# Patient Record
Sex: Male | Born: 1965 | Race: Black or African American | Hispanic: No | Marital: Married | Smoking: Never smoker
Health system: Southern US, Community
[De-identification: ages and names within clinical notes are randomized; demographics above are authoritative.]

## PROBLEM LIST (undated history)

## (undated) DIAGNOSIS — G40909 Epilepsy, unspecified, not intractable, without status epilepticus: Secondary | ICD-10-CM

## (undated) DIAGNOSIS — E785 Hyperlipidemia, unspecified: Secondary | ICD-10-CM

## (undated) DIAGNOSIS — E669 Obesity, unspecified: Secondary | ICD-10-CM

## (undated) DIAGNOSIS — N451 Epididymitis: Secondary | ICD-10-CM

## (undated) DIAGNOSIS — I10 Essential (primary) hypertension: Secondary | ICD-10-CM

## (undated) HISTORY — DX: Hyperlipidemia, unspecified: E78.5

## (undated) HISTORY — DX: Obesity, unspecified: E66.9

## (undated) HISTORY — DX: Epilepsy, unspecified, not intractable, without status epilepticus: G40.909

## (undated) HISTORY — DX: Essential (primary) hypertension: I10

## (undated) HISTORY — DX: Epididymitis: N45.1

---

## 2004-03-22 ENCOUNTER — Ambulatory Visit: Payer: Self-pay | Admitting: Family Medicine

## 2004-04-02 ENCOUNTER — Ambulatory Visit: Payer: Self-pay | Admitting: Family Medicine

## 2004-04-12 ENCOUNTER — Ambulatory Visit: Payer: Self-pay | Admitting: Family Medicine

## 2004-07-22 ENCOUNTER — Ambulatory Visit: Payer: Self-pay | Admitting: Family Medicine

## 2004-11-27 ENCOUNTER — Ambulatory Visit: Payer: Self-pay | Admitting: Family Medicine

## 2005-03-27 ENCOUNTER — Ambulatory Visit: Payer: Self-pay | Admitting: Family Medicine

## 2005-07-25 ENCOUNTER — Ambulatory Visit: Payer: Self-pay | Admitting: Family Medicine

## 2005-12-08 ENCOUNTER — Ambulatory Visit: Payer: Self-pay | Admitting: Family Medicine

## 2005-12-09 ENCOUNTER — Ambulatory Visit: Payer: Self-pay | Admitting: Family Medicine

## 2006-04-13 ENCOUNTER — Ambulatory Visit: Payer: Self-pay | Admitting: Family Medicine

## 2006-04-14 ENCOUNTER — Encounter (INDEPENDENT_AMBULATORY_CARE_PROVIDER_SITE_OTHER): Payer: Self-pay | Admitting: *Deleted

## 2006-04-14 ENCOUNTER — Encounter: Payer: Self-pay | Admitting: Family Medicine

## 2006-04-14 LAB — CONVERTED CEMR LAB
ALT: 33 units/L (ref 0–53)
AST: 20 units/L (ref 0–37)
Albumin: 4.5 g/dL (ref 3.5–5.2)
Alkaline Phosphatase: 73 units/L (ref 39–117)
BUN: 10 mg/dL (ref 6–23)
Bilirubin, Direct: 0.1 mg/dL (ref 0.0–0.3)
CO2: 24 meq/L (ref 19–32)
Calcium: 10.1 mg/dL (ref 8.4–10.5)
Chloride: 102 meq/L (ref 96–112)
Cholesterol: 187 mg/dL (ref 0–200)
Creatinine, Ser: 0.87 mg/dL (ref 0.40–1.50)
Glucose, Bld: 91 mg/dL (ref 70–99)
HDL: 55 mg/dL (ref >39)
Indirect Bilirubin: 0.4 mg/dL (ref 0.0–0.9)
LDL Cholesterol: 113 mg/dL — ABNORMAL HIGH (ref 0–99)
PSA: 0.63 ng/mL (ref 0.10–4.00)
Phenytoin Lvl: 20.2 ug/mL — ABNORMAL HIGH (ref 10.0–20.0)
Potassium: 4.3 meq/L (ref 3.5–5.3)
Sodium: 142 meq/L (ref 135–145)
Total Bilirubin: 0.5 mg/dL (ref 0.3–1.2)
Total CHOL/HDL Ratio: 3.4
Total Protein: 8 g/dL (ref 6.0–8.3)
Triglycerides: 96 mg/dL (ref <150)
VLDL: 19 mg/dL (ref 0–40)

## 2006-07-05 ENCOUNTER — Emergency Department (HOSPITAL_COMMUNITY): Admission: EM | Admit: 2006-07-05 | Discharge: 2006-07-05 | Payer: Self-pay | Admitting: Emergency Medicine

## 2006-07-06 ENCOUNTER — Ambulatory Visit: Payer: Self-pay | Admitting: Family Medicine

## 2006-07-06 LAB — CONVERTED CEMR LAB: Phenytoin Lvl: 15.9 ug/mL (ref 10.0–20.0)

## 2006-07-07 ENCOUNTER — Ambulatory Visit (HOSPITAL_COMMUNITY): Admission: RE | Admit: 2006-07-07 | Discharge: 2006-07-07 | Payer: Self-pay | Admitting: Family Medicine

## 2006-07-13 ENCOUNTER — Encounter: Payer: Self-pay | Admitting: Family Medicine

## 2006-07-20 ENCOUNTER — Encounter: Payer: Self-pay | Admitting: Family Medicine

## 2006-07-27 ENCOUNTER — Encounter: Payer: Self-pay | Admitting: Family Medicine

## 2006-07-27 LAB — CONVERTED CEMR LAB: Phenytoin Lvl: 22 ug/mL — ABNORMAL HIGH (ref 10.0–20.0)

## 2006-08-10 ENCOUNTER — Encounter: Payer: Self-pay | Admitting: Family Medicine

## 2006-08-10 LAB — CONVERTED CEMR LAB: Phenytoin Lvl: 21.1 ug/mL — ABNORMAL HIGH (ref 10.0–20.0)

## 2006-09-08 ENCOUNTER — Encounter: Payer: Self-pay | Admitting: Family Medicine

## 2006-09-09 ENCOUNTER — Ambulatory Visit: Payer: Self-pay | Admitting: Family Medicine

## 2006-10-28 ENCOUNTER — Ambulatory Visit: Payer: Self-pay | Admitting: Family Medicine

## 2006-12-07 ENCOUNTER — Encounter (INDEPENDENT_AMBULATORY_CARE_PROVIDER_SITE_OTHER): Payer: Self-pay | Admitting: *Deleted

## 2006-12-07 ENCOUNTER — Encounter: Payer: Self-pay | Admitting: Family Medicine

## 2006-12-07 LAB — CONVERTED CEMR LAB
ALT: 39 units/L (ref 0–53)
AST: 26 units/L (ref 0–37)
Albumin: 4.3 g/dL (ref 3.5–5.2)
Alkaline Phosphatase: 71 units/L (ref 39–117)
Basophils Absolute: 0 10*3/uL (ref 0.0–0.1)
Basophils Relative: 1 % (ref 0–1)
Blood Glucose, Fasting: 89 mg/dL
CO2: 28 meq/L (ref 19–32)
Calcium: 9.6 mg/dL (ref 8.4–10.5)
Cholesterol: 181 mg/dL (ref 0–200)
Creatinine, Ser: 0.87 mg/dL (ref 0.40–1.50)
HDL: 51 mg/dL (ref >39)
Lymphs Abs: 1.8 10*3/uL (ref 0.7–3.3)
Monocytes Relative: 10 % (ref 3–11)
Neutro Abs: 1.6 10*3/uL — ABNORMAL LOW (ref 1.7–7.7)
Neutrophils Relative %: 40 % — ABNORMAL LOW (ref 43–77)
Phenytoin Lvl: 15.2 ug/mL (ref 10.0–20.0)
RBC count: 5.22 10*6/uL
RBC: 5.22 M/uL (ref 4.22–5.81)
Sodium: 140 meq/L (ref 135–145)
Total Bilirubin: 0.3 mg/dL (ref 0.3–1.2)
Total CHOL/HDL Ratio: 3.5
Triglycerides: 219 mg/dL — ABNORMAL HIGH (ref <150)
WBC: 3.9 10*3/uL — ABNORMAL LOW (ref 4.0–10.5)

## 2007-02-23 ENCOUNTER — Ambulatory Visit: Payer: Self-pay | Admitting: Family Medicine

## 2007-03-30 ENCOUNTER — Encounter: Payer: Self-pay | Admitting: *Deleted

## 2007-03-30 DIAGNOSIS — R569 Unspecified convulsions: Secondary | ICD-10-CM

## 2007-03-30 DIAGNOSIS — E785 Hyperlipidemia, unspecified: Secondary | ICD-10-CM

## 2007-03-30 DIAGNOSIS — I10 Essential (primary) hypertension: Secondary | ICD-10-CM | POA: Insufficient documentation

## 2007-04-26 ENCOUNTER — Ambulatory Visit: Payer: Self-pay | Admitting: Family Medicine

## 2007-06-01 ENCOUNTER — Encounter: Payer: Self-pay | Admitting: Family Medicine

## 2007-06-01 LAB — CONVERTED CEMR LAB
ALT: 28 units/L (ref 0–53)
Chloride: 106 meq/L (ref 96–112)
Cholesterol: 172 mg/dL (ref 0–200)
Glucose, Bld: 90 mg/dL (ref 70–99)
LDL Cholesterol: 99 mg/dL (ref 0–99)
PSA: 0.72 ng/mL (ref 0.10–4.00)
Phenytoin Lvl: 7.4 ug/mL — ABNORMAL LOW (ref 10.0–20.0)
Potassium: 3.5 meq/L (ref 3.5–5.3)
Sodium: 142 meq/L (ref 135–145)
Total CHOL/HDL Ratio: 3.5
Total Protein: 7.1 g/dL (ref 6.0–8.3)
Triglycerides: 119 mg/dL (ref <150)
VLDL: 24 mg/dL (ref 0–40)

## 2007-06-03 ENCOUNTER — Ambulatory Visit: Payer: Self-pay | Admitting: Family Medicine

## 2007-06-03 ENCOUNTER — Encounter: Payer: Self-pay | Admitting: Family Medicine

## 2007-06-09 ENCOUNTER — Encounter: Payer: Self-pay | Admitting: Family Medicine

## 2007-06-09 LAB — CONVERTED CEMR LAB: Phenytoin Lvl: 16 ug/mL (ref 10.0–20.0)

## 2007-09-20 ENCOUNTER — Encounter: Payer: Self-pay | Admitting: Family Medicine

## 2007-09-20 LAB — CONVERTED CEMR LAB
ALT: 29 units/L (ref 0–53)
AST: 16 units/L (ref 0–37)
Alkaline Phosphatase: 71 units/L (ref 39–117)
BUN: 12 mg/dL (ref 6–23)
Calcium: 8.6 mg/dL (ref 8.4–10.5)
Cholesterol: 220 mg/dL — ABNORMAL HIGH (ref 0–200)
Creatinine, Ser: 0.77 mg/dL (ref 0.40–1.50)
Total Protein: 7.1 g/dL (ref 6.0–8.3)
Triglycerides: 163 mg/dL — ABNORMAL HIGH (ref <150)

## 2007-11-02 IMAGING — CT CT HEAD W/O CM
1 series · 16 of 30 positions shown, 20 images · IV contrast (agent unspecified)
Comparison: None.

CLINICAL DATA: Seizure. 
 HEAD CT WITHOUT CONTRAST:
TECHNIQUE: Contiguous axial images were obtained from the base of the skull through the vertex according to standard protocol without contrast.

[Series 2: headseq 4.8 h37s · axial · 0.43mm/px · z∈[+66,+221]mm · 16 of 36 slices shown, 20 images]
[im 2/36  brain]
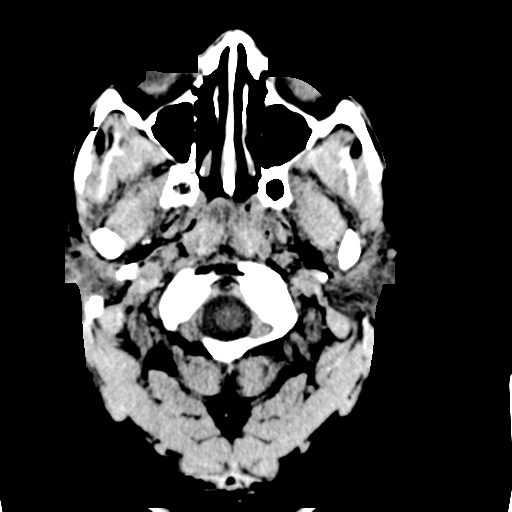
[im 2/36  bone]
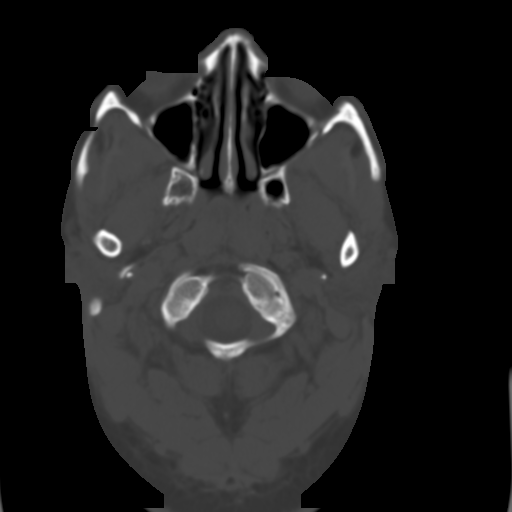
[im 4/36  brain]
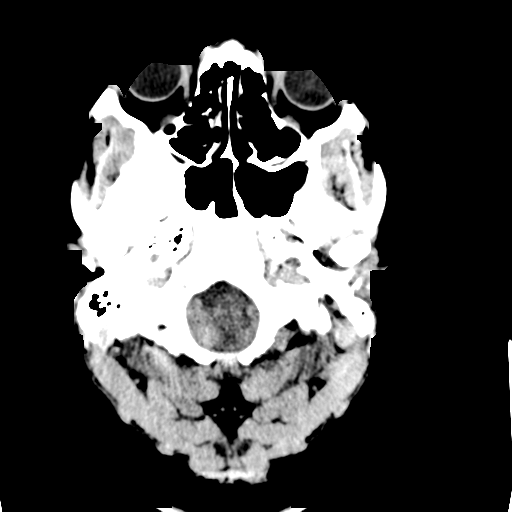
[im 7/36  brain]
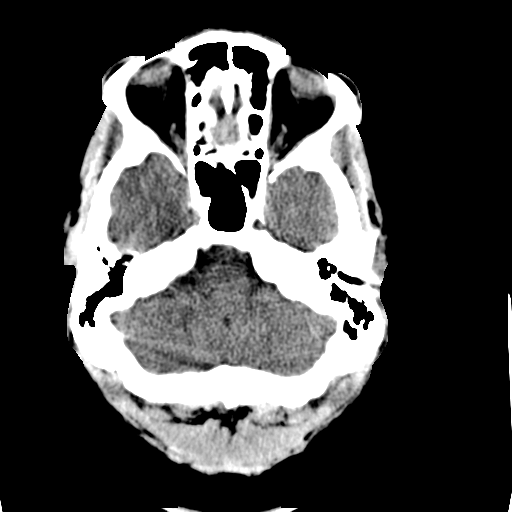
[im 9/36  brain]
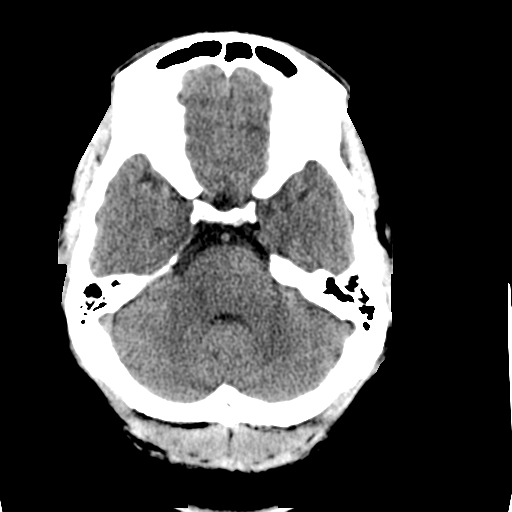
[im 10/36  brain]
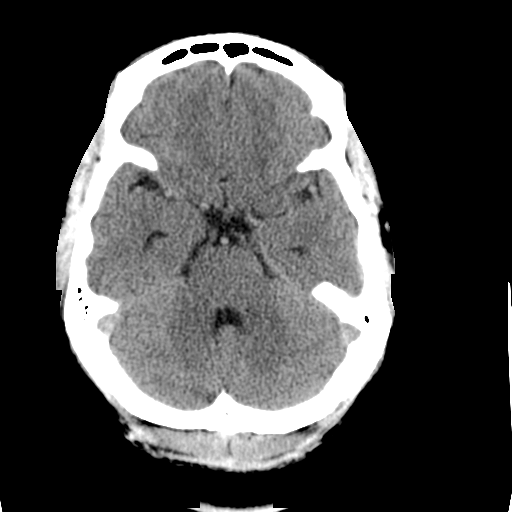
[im 10/36  bone]
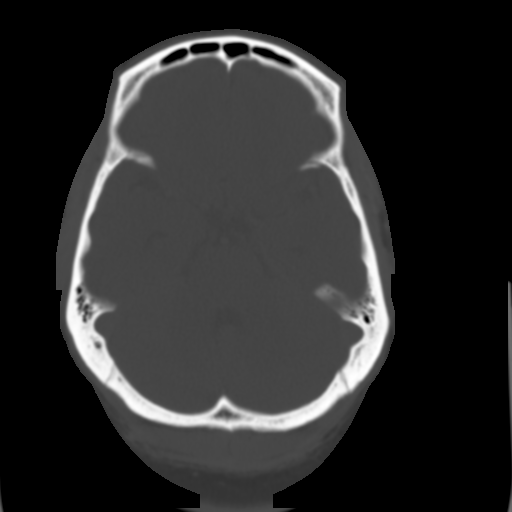
[im 13/36  brain]
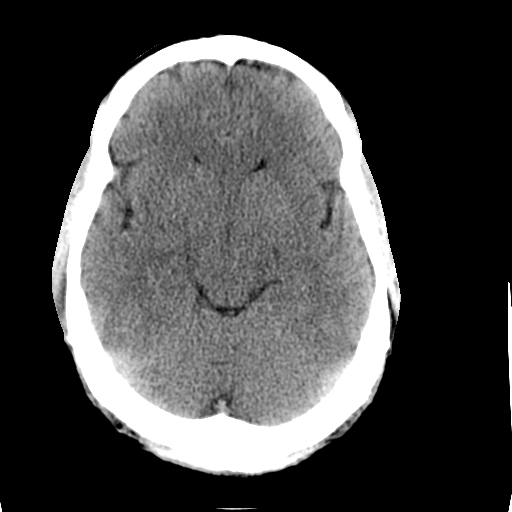
[im 15/36  brain]
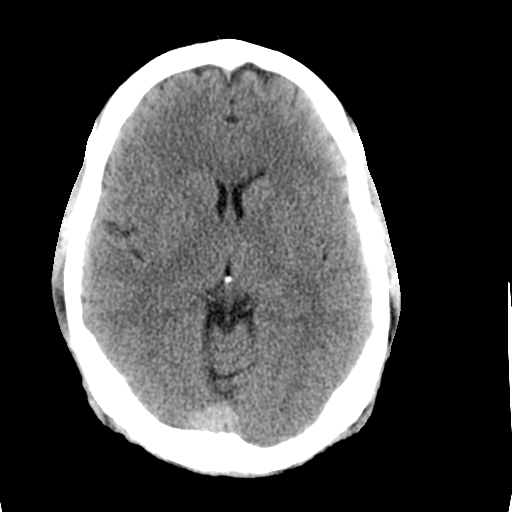
[im 17/36  brain]
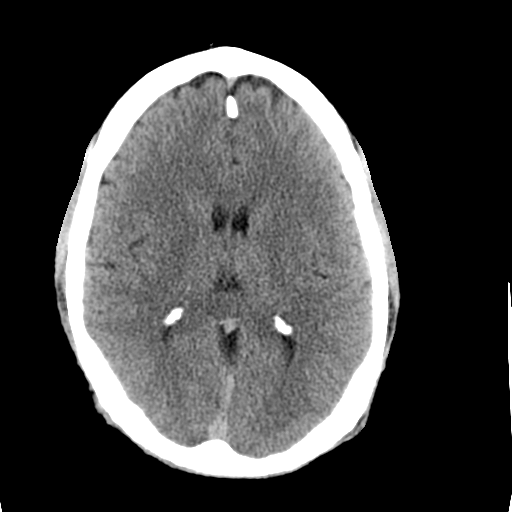
[im 19/36  brain]
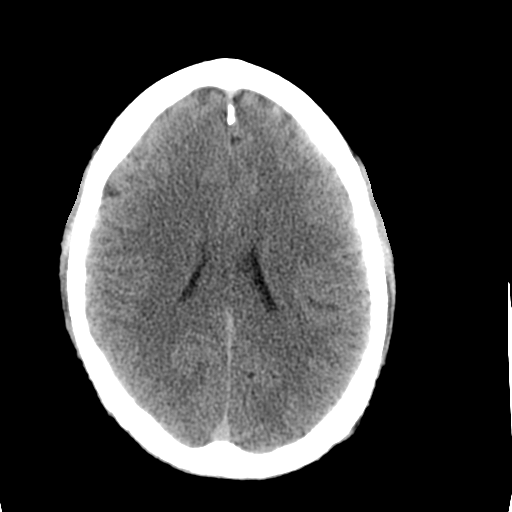
[im 19/36  bone]
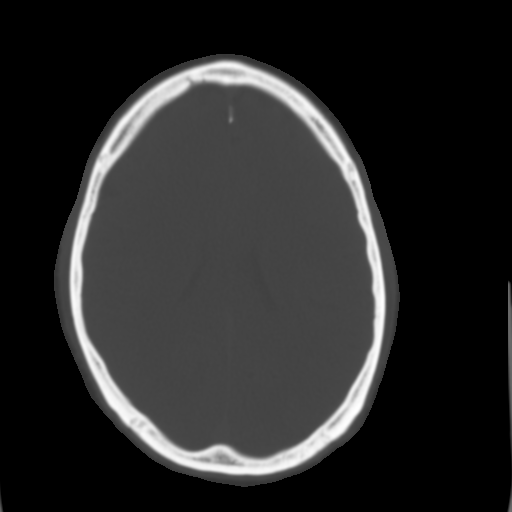
[im 21/36  brain]
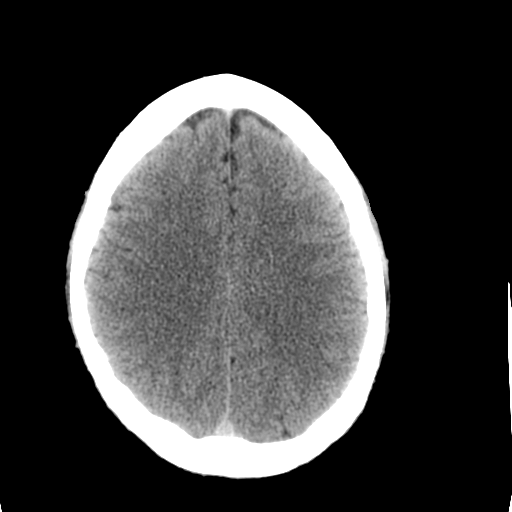
[im 23/36  brain]
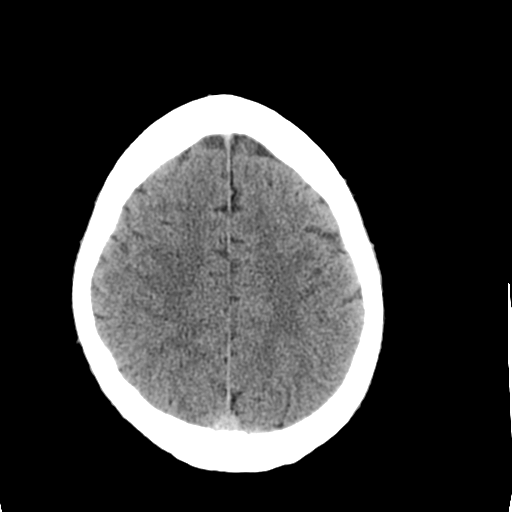
[im 26/36  brain]
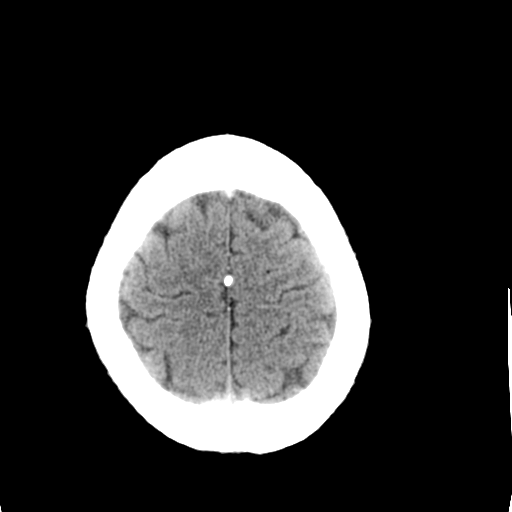
[im 27/36  brain]
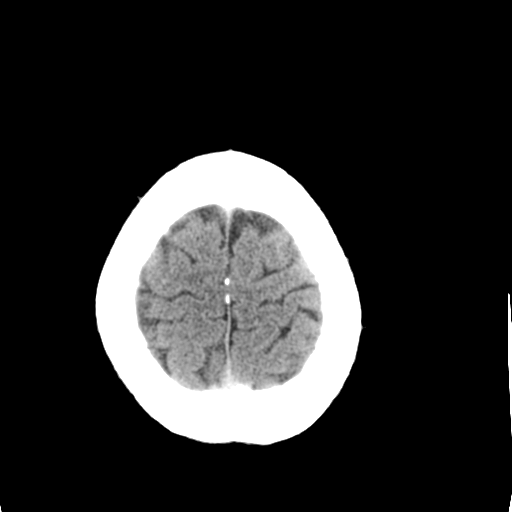
[im 27/36  bone]
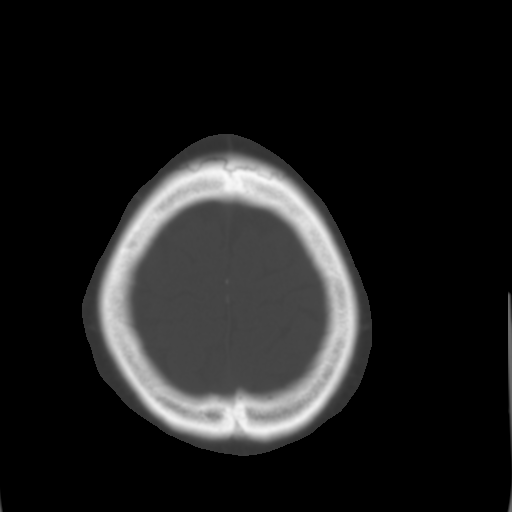
[im 29/36  brain]
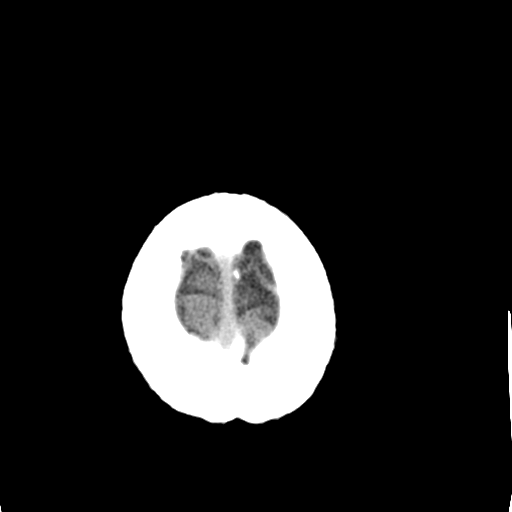
[im 32/36  brain]
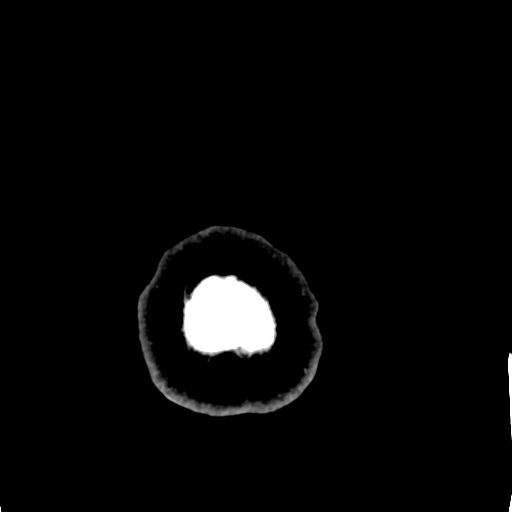
[im 34/36  brain]
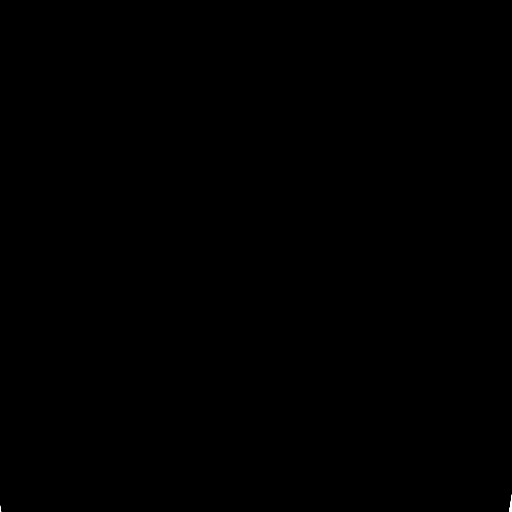

[16 of 30 positions shown; findings below may reference images not displayed]

FINDINGS: There is no evidence of intracranial hemorrhage, brain edema, acute infarct, mass lesion, or mass effect.  No other intra-axial abnormalities are seen, and the ventricles are within normal limits.  No abnormal extra-axial fluid collections or masses are identified.  No skull abnormalities are noted.
IMPRESSION: Negative non-contrast head CT.

## 2007-11-09 ENCOUNTER — Ambulatory Visit: Payer: Self-pay | Admitting: Family Medicine

## 2007-11-12 ENCOUNTER — Encounter: Payer: Self-pay | Admitting: Family Medicine

## 2008-02-08 ENCOUNTER — Ambulatory Visit: Payer: Self-pay | Admitting: Family Medicine

## 2008-02-14 LAB — CONVERTED CEMR LAB
ALT: 35 units/L (ref 0–53)
Bilirubin, Direct: 0.1 mg/dL (ref 0.0–0.3)
Cholesterol: 232 mg/dL — ABNORMAL HIGH (ref 0–200)
LDL Cholesterol: 141 mg/dL — ABNORMAL HIGH (ref 0–99)
Total Bilirubin: 0.5 mg/dL (ref 0.3–1.2)
Total CHOL/HDL Ratio: 4.1
VLDL: 35 mg/dL (ref 0–40)

## 2008-06-14 ENCOUNTER — Telehealth: Payer: Self-pay | Admitting: Family Medicine

## 2008-07-11 ENCOUNTER — Ambulatory Visit: Payer: Self-pay | Admitting: Family Medicine

## 2008-07-11 DIAGNOSIS — R5383 Other fatigue: Secondary | ICD-10-CM

## 2008-07-11 DIAGNOSIS — R5381 Other malaise: Secondary | ICD-10-CM | POA: Insufficient documentation

## 2008-07-11 DIAGNOSIS — J309 Allergic rhinitis, unspecified: Secondary | ICD-10-CM | POA: Insufficient documentation

## 2008-07-12 LAB — CONVERTED CEMR LAB
Bilirubin, Direct: 0.1 mg/dL (ref 0.0–0.3)
CO2: 27 meq/L (ref 19–32)
Calcium: 9.7 mg/dL (ref 8.4–10.5)
Glucose, Bld: 93 mg/dL (ref 70–99)
MCV: 86.3 fL (ref 78.0–100.0)
PSA: 0.73 ng/mL (ref 0.10–4.00)
Phenytoin Lvl: 6.9 ug/mL — ABNORMAL LOW (ref 10.0–20.0)
Potassium: 3.8 meq/L (ref 3.5–5.3)
RBC: 5.17 M/uL (ref 4.22–5.81)
Sodium: 140 meq/L (ref 135–145)
TSH: 1.234 microintl units/mL (ref 0.350–4.500)
Total Bilirubin: 0.4 mg/dL (ref 0.3–1.2)
Total CHOL/HDL Ratio: 3.8
VLDL: 22 mg/dL (ref 0–40)
WBC: 4.2 10*3/uL (ref 4.0–10.5)

## 2008-09-12 ENCOUNTER — Telehealth: Payer: Self-pay | Admitting: Family Medicine

## 2008-09-15 ENCOUNTER — Telehealth: Payer: Self-pay | Admitting: Family Medicine

## 2008-11-14 ENCOUNTER — Ambulatory Visit: Payer: Self-pay | Admitting: Family Medicine

## 2008-11-14 LAB — CONVERTED CEMR LAB: OCCULT 1: NEGATIVE

## 2008-11-15 LAB — CONVERTED CEMR LAB
ALT: 33 units/L (ref 0–53)
AST: 24 units/L (ref 0–37)
Albumin: 4.5 g/dL (ref 3.5–5.2)
Calcium: 9.5 mg/dL (ref 8.4–10.5)
Cholesterol: 187 mg/dL (ref 0–200)
HDL: 59 mg/dL (ref >39)
Phenytoin Lvl: 16.8 ug/mL (ref 10.0–20.0)
Sodium: 139 meq/L (ref 135–145)
Total CHOL/HDL Ratio: 3.2

## 2008-12-22 ENCOUNTER — Encounter: Payer: Self-pay | Admitting: Family Medicine

## 2009-02-15 ENCOUNTER — Encounter: Payer: Self-pay | Admitting: Family Medicine

## 2009-03-19 ENCOUNTER — Ambulatory Visit: Payer: Self-pay | Admitting: Family Medicine

## 2009-03-26 LAB — CONVERTED CEMR LAB
AST: 20 units/L (ref 0–37)
Albumin: 4.5 g/dL (ref 3.5–5.2)
Alkaline Phosphatase: 64 units/L (ref 39–117)
CO2: 27 meq/L (ref 19–32)
Calcium: 9.3 mg/dL (ref 8.4–10.5)
Creatinine, Ser: 0.87 mg/dL (ref 0.40–1.50)
HDL: 55 mg/dL (ref >39)
Indirect Bilirubin: 0.3 mg/dL (ref 0.0–0.9)
Total Bilirubin: 0.4 mg/dL (ref 0.3–1.2)
Triglycerides: 122 mg/dL (ref <150)

## 2009-07-10 ENCOUNTER — Telehealth: Payer: Self-pay | Admitting: Family Medicine

## 2009-07-26 ENCOUNTER — Telehealth: Payer: Self-pay | Admitting: Family Medicine

## 2009-08-24 ENCOUNTER — Ambulatory Visit: Payer: Self-pay | Admitting: Family Medicine

## 2009-08-25 ENCOUNTER — Encounter: Payer: Self-pay | Admitting: Family Medicine

## 2009-08-28 LAB — CONVERTED CEMR LAB
ALT: 41 units/L (ref 0–53)
AST: 27 units/L (ref 0–37)
BUN: 11 mg/dL (ref 6–23)
Basophils Absolute: 0 10*3/uL (ref 0.0–0.1)
Basophils Relative: 1 % (ref 0–1)
Bilirubin, Direct: 0.1 mg/dL (ref 0.0–0.3)
CO2: 29 meq/L (ref 19–32)
Calcium: 9.9 mg/dL (ref 8.4–10.5)
Cholesterol: 145 mg/dL (ref 0–200)
Creatinine, Ser: 0.9 mg/dL (ref 0.40–1.50)
Eosinophils Absolute: 0.2 10*3/uL (ref 0.0–0.7)
Eosinophils Relative: 5 % (ref 0–5)
Glucose, Bld: 91 mg/dL (ref 70–99)
Hemoglobin: 16.1 g/dL (ref 13.0–17.0)
Indirect Bilirubin: 0.3 mg/dL (ref 0.0–0.9)
MCHC: 33.8 g/dL (ref 30.0–36.0)
MCV: 88.2 fL (ref 78.0–100.0)
Monocytes Absolute: 0.4 10*3/uL (ref 0.1–1.0)
Neutro Abs: 1.9 10*3/uL (ref 1.7–7.7)
RDW: 13.2 % (ref 11.5–15.5)
Total Bilirubin: 0.4 mg/dL (ref 0.3–1.2)
Vit D, 25-Hydroxy: 21 ng/mL — ABNORMAL LOW (ref 30–89)

## 2009-09-03 ENCOUNTER — Telehealth: Payer: Self-pay | Admitting: Family Medicine

## 2009-10-24 ENCOUNTER — Ambulatory Visit: Payer: Self-pay | Admitting: Family Medicine

## 2009-10-26 ENCOUNTER — Encounter: Payer: Self-pay | Admitting: Family Medicine

## 2010-01-14 ENCOUNTER — Telehealth: Payer: Self-pay | Admitting: Family Medicine

## 2010-01-17 ENCOUNTER — Encounter: Payer: Self-pay | Admitting: Family Medicine

## 2010-03-05 ENCOUNTER — Ambulatory Visit: Payer: Self-pay | Admitting: Family Medicine

## 2010-03-05 LAB — CONVERTED CEMR LAB: OCCULT 1: NEGATIVE

## 2010-03-07 LAB — CONVERTED CEMR LAB
AST: 24 units/L (ref 0–37)
Albumin: 4.7 g/dL (ref 3.5–5.2)
Alkaline Phosphatase: 79 units/L (ref 39–117)
CO2: 28 meq/L (ref 19–32)
Calcium: 9.9 mg/dL (ref 8.4–10.5)
Chloride: 98 meq/L (ref 96–112)
Creatinine, Ser: 0.86 mg/dL (ref 0.40–1.50)
HDL: 61 mg/dL (ref >39)
LDL Cholesterol: 144 mg/dL — ABNORMAL HIGH (ref 0–99)
Phenytoin Lvl: 15 ug/mL (ref 10.0–20.0)
Sodium: 140 meq/L (ref 135–145)
Total Bilirubin: 0.4 mg/dL (ref 0.3–1.2)
Total CHOL/HDL Ratio: 3.8
Total Protein: 8 g/dL (ref 6.0–8.3)
Triglycerides: 118 mg/dL (ref <150)

## 2010-04-09 NOTE — Progress Notes (Signed)
Summary: PAPER  Phone Note Call from Patient   Summary of Call: FAXED OVER A ORDER FOR SOMETHING  CALL BACK AT [phone] REF # [id number] NO LATER THAN WEDNESDAY AT 2 TO MAKE SURE PATIENT GETS WHAT HE WANTS Initial call taken by: Lind Guest,  September 03, 2009 2:11 PM  Follow-up for Phone Call        called patient, left message  no action will be taken unless patient calls back and requests these services, i have made an attempt to contact patient and left a message for him to return call Follow-up by: Adella Hare LPN,  September 03, 2009 2:15 PM

## 2010-04-09 NOTE — Progress Notes (Signed)
  Phone Note Call from Patient   Caller: Spouse Summary of Call: pt's 3 month of dilantin not delivered despite calling 2 weeks ago, requested a 1 week supply, this was called in to a local [facility], dilantin 100mg  3 in am and 4 in pm , #49 per spouse's directions.Call made on 01/12/2010 Initial call taken by: Syliva Overman MD,  January 14, 2010 2:40 PM

## 2010-04-09 NOTE — Miscellaneous (Signed)
Summary: Immunizations  Immunizations   Imported By: Lind Guest 10/26/2009 11:08:01  _____________________________________________________________________  External Attachment:    Type:   Image     Comment:   External Document

## 2010-04-09 NOTE — Progress Notes (Signed)
Summary: medicine  Phone Note Call from Patient   Summary of Call: needs dilantin and bp medicine sent to [facility] Initial call taken by: Lind Guest,  Jul 26, 2009 2:45 PM    Prescriptions: HYDROCHLOROTHIAZIDE 25 MG  TABS (HYDROCHLOROTHIAZIDE) Take one tablet by mouth once a day  #90 x 0   Entered by:   Everitt Amber LPN   Authorized by:   Syliva Overman MD   Signed by:   Everitt Amber LPN on 84/13/2440   Method used:   Electronically to        [facility] (mail-order)             ,          Ph: [phone]       Fax: [phone]   RxID:   [id number] DILANTIN 100 MG  CAPS (PHENYTOIN SODIUM EXTENDED) Take four capsules by mouth at bedtime and three every morning  #210 x 0   Entered by:   Everitt Amber LPN   Authorized by:   Syliva Overman MD   Signed by:   Everitt Amber LPN on 16/60/6301   Method used:   Electronically to        [facility]* (mail-order)             ,          Ph: [phone]       Fax: [phone]   RxID:   [id number]

## 2010-04-09 NOTE — Assessment & Plan Note (Signed)
Summary: tb test  Nurse Visit   Allergies: No Known Drug Allergies  Immunizations Administered:  PPD Skin Test:    Vaccine Type: PPD    Site: right forearm    Mfr: Sanofi Pasteur    Dose: 0.1 ml    Route: ID    Given by: Adella Hare LPN    Exp. Date: 01/05/2011    Lot #: [id number]  Orders Added: 1)  TB Skin Test [86580] 2)  Admin 1st Vaccine [90471]  Appended Document: tb test    Clinical Lists Changes  Observations: Added new observation of TB PPDRESULT: negative (10/26/2009 8:23) Added new observation of PPD RESULT: < 5mm (10/26/2009 8:23) Added new observation of TB-PPD RDDTE: 10/26/2009 (10/26/2009 8:23)       PPD Results    Date of reading: 10/26/2009    Results: < 5mm    Interpretation: negative

## 2010-04-09 NOTE — Assessment & Plan Note (Signed)
Summary: F UP   Vital Signs:  Patient profile:   45 year old male Height:      70 inches Weight:      215.75 pounds BMI:     31.07 O2 Sat:      97 % Pulse rate:   72 / minute Pulse rhythm:   regular Resp:     16 per minute BP sitting:   114 / 84  (left arm) Cuff size:   large  Vitals Entered By: Everitt Amber LPN (August 24, 2009 10:31 AM)  Nutrition Counseling: Patient's BMI is greater than 25 and therefore counseled on weight management options. CC: Follow up chronic problems, wants to be taken off some meds because he has been doing good   CC:  Follow up chronic problems and wants to be taken off some meds because he has been doing good.  History of Present Illness: Reports  that  he has been  doing well. Denies recent fever or chills. Denies sinus pressure, nasal congestion , ear pain or sore throat. Denies chest congestion, or cough productive of sputum. Denies chest pain, palpitations, PND, orthopnea or leg swelling. Denies abdominal pain, nausea, vomitting, diarrhea or constipation. Denies change in bowel movements or bloody stool. Denies dysuria , frequency, incontinence or hesitancy. Denies  joint pain, swelling, or reduced mobility. Denies headaches, vertigo, seizures. Denies depression, anxiety or insomnia. Denies  rash, lesions, or itch.     Current Medications (verified): 1)  Dilantin 100 Mg  Caps (Phenytoin Sodium Extended) .... Take Four Capsules By Mouth At Bedtime and Three Every Morning 2)  Hydrochlorothiazide 25 Mg  Tabs (Hydrochlorothiazide) .... Take One Tablet By Mouth Once A Day 3)  Flonase 50 Mcg/act Susp (Fluticasone Propionate) .Marland Kitchen.. 1 Puff Per Nostril Twice Daily As Needed 4)  Lipitor 40 Mg Tabs (Atorvastatin Calcium) .... One Tab By Mouth Qhs  Allergies (verified): No Known Drug Allergies  Review of Systems      See HPI General:  Complains of fatigue. Eyes:  Denies blurring and discharge. Heme:  Denies abnormal bruising and  bleeding. Allergy:  Denies hives or rash.  Physical Exam  General:  Well-developed,well-nourished,in no acute distress; alert,appropriate and cooperative throughout examination HEENT: No facial asymmetry,  EOMI, No sinus tenderness, TM's Clear, oropharynx  pink and moist.   Chest: Clear to auscultation bilaterally.  CVS: S1, S2, No murmurs, No S3.   Abd: Soft, Nontender.  MS: Adequate ROM spine, hips, shoulders and knees.  Ext: No edema.   CNS: CN 2-12 intact, power tone and sensation normal throughout.   Skin: Intact, no visible lesions or rashes.  Psych: Good eye contact, normal affect.  Memory intact, not anxious or depressed appearing.    Impression & Recommendations:  Problem # 1:  OBESITY, MILD (ICD-278.02) Assessment Improved  Ht: 70 (08/24/2009)   Wt: 215.75 (08/24/2009)   BMI: 31.07 (08/24/2009)  Problem # 2:  ESSENTIAL HYPERTENSION, BENIGN (ICD-401.1) Assessment: Unchanged  His updated medication list for this problem includes:    Hydrochlorothiazide 25 Mg Tabs (Hydrochlorothiazide) .Marland Kitchen... Take one tablet by mouth once a day  Orders: T-Basic Metabolic Panel [phone])  BP today: 114/84 Prior BP: 112/80 (03/19/2009)  Labs Reviewed: K+: 4.2 (03/19/2009) Creat: : 0.87 (03/19/2009)   Chol: 232 (03/19/2009)   HDL: 55 (03/19/2009)   LDL: 153 (03/19/2009)   TG: 122 (03/19/2009)  Problem # 3:  SEIZURE DISORDER (ICD-780.39) Assessment: Unchanged  His updated medication list for this problem includes:    Dilantin 100 Mg  Caps (Phenytoin sodium extended) .Marland Kitchen... Take four capsules by mouth at bedtime and three every morning  Orders: T-Dilantin (Phenytoin) [phone])  Labs Reviewed: Hgb: 15.5 (07/11/2008)   Hct: 44.6 (07/11/2008)   WBC: 4.2 (07/11/2008)   RBC: 5.17 (07/11/2008)   Plts: 120 (07/11/2008) SGOT: 20 (03/19/2009)   SGPT: 25 (03/19/2009)    Dilantin: 9.7 (03/19/2009)     Complete Medication List: 1)  Dilantin 100 Mg Caps (Phenytoin sodium extended)  .... Take four capsules by mouth at bedtime and three every morning 2)  Hydrochlorothiazide 25 Mg Tabs (Hydrochlorothiazide) .... Take one tablet by mouth once a day 3)  Flonase 50 Mcg/act Susp (Fluticasone propionate) .Marland Kitchen.. 1 puff per nostril twice daily as needed 4)  Lipitor 40 Mg Tabs (Atorvastatin calcium) .... One tab by mouth qhs  Other Orders: T-Hepatic Function [phone]) T-Lipid Profile [phone]) T-CBC w/Diff [phone]) T-PSA [id number]) T-Vitamin D (25-Hydroxy) [id number]) T-TSH [id number])  Patient Instructions: 1)  CPE in 5months and 3 weeks. 2)  It is important that you exercise regularly at least 20 minutes 5 times a week. If you develop chest pain, have severe difficulty breathing, or feel very tired , stop exercising immediately and seek medical attention. 3)  You need to lose weight. Consider a lower calorie diet and regular exercise. Congrats on the weight you have lost.Keep it up 4)  BMP prior to visit, ICD-9: 5)  Hepatic Panel prior to visit, ICD-9: 6)  Lipid Panel prior to visit, ICD-9: 7)  CBC w/ Diff prior to visit, ICD-9: 8)  PSA prior to visit, ICD-9: 9)  Dilantin this week 10)  Vit D and TSH Prescriptions: LIPITOR 40 MG TABS (ATORVASTATIN CALCIUM) one tab by mouth qhs  #90 x 3   Entered by:   Everitt Amber LPN   Authorized by:   Syliva Overman MD   Signed by:   Everitt Amber LPN on 83/15/1761   Method used:   Faxed to ...       [facility] (mail-order)             , [place]         Ph: [phone]       Fax: [phone]   RxID:   [id number] HYDROCHLOROTHIAZIDE 25 MG  TABS (HYDROCHLOROTHIAZIDE) Take one tablet by mouth once a day  #90 x 3   Entered by:   Everitt Amber LPN   Authorized by:   Syliva Overman MD   Signed by:   Everitt Amber LPN on 67/89/3810   Method used:   Faxed to ...       [facility] (mail-order)             , [place]         Ph: [phone]       Fax: [phone]   RxID:   [id number] DILANTIN 100 MG  CAPS (PHENYTOIN SODIUM  EXTENDED) Take four capsules by mouth at bedtime and three every morning  #210 x 3   Entered by:   Everitt Amber LPN   Authorized by:   Syliva Overman MD   Signed by:   Everitt Amber LPN on 09/32/6712   Method used:   Faxed to ...       [facility] (mail-order)             , [place]         Ph: [phone]       Fax: [phone]   RxID:   [id number]

## 2010-04-09 NOTE — Progress Notes (Signed)
Summary: MEDICINES  Phone Note Call from Patient   Summary of Call: WANTS TO TALK TO SOMEONE ABOUT HIS MEDICINES CALL BACK AT [phone] Initial call taken by: Lind Guest,  Jul 10, 2009 8:25 AM  Follow-up for Phone Call        Phone Call Completed, Rx Called In Follow-up by: Adella Hare LPN,  Jul 11, 4538 8:45 AM    Prescriptions: LIPITOR 40 MG TABS (ATORVASTATIN CALCIUM) one tab by mouth qhs  #90 x 0   Entered by:   Adella Hare LPN   Authorized by:   Syliva Overman MD   Signed by:   Adella Hare LPN on 98/01/9146   Method used:   Electronically to        [facility] (mail-order)             ,          Ph: [phone]       Fax: [phone]   RxID:   [id number] DILANTIN 100 MG  CAPS (PHENYTOIN SODIUM EXTENDED) Take four capsules by mouth at bedtime and three every morning  #210 x 0   Entered by:   Adella Hare LPN   Authorized by:   Syliva Overman MD   Signed by:   Adella Hare LPN on 36/64/4034   Method used:   Electronically to        [facility] (mail-order)             ,          Ph: [phone]       Fax: [phone]   RxID:   [id number]

## 2010-04-09 NOTE — Letter (Signed)
Summary: STANDING ORDER REQUET LAB  [facility]QU[facility]   Imported By: Lind Guest 01/17/2010 09:22:51  _____________________________________________________________________  External Attachment:    Type:   Image     Comment:   External Document

## 2010-04-09 NOTE — Assessment & Plan Note (Signed)
Summary: office visit   Vital Signs:  Patient profile:   45 year old male Height:      70 inches Weight:      223 pounds BMI:     32.11 O2 Sat:      97 % Pulse rate:   80 / minute Pulse rhythm:   regular Resp:     16 per minute BP sitting:   112 / 80  (right arm) Cuff size:   large  Vitals Entered By: Everitt Amber (March 19, 2009 9:07 AM)  Nutrition Counseling: Patient's BMI is greater than 25 and therefore counseled on weight management options. CC: Follow up chronic problems Is Patient Diabetic? No   CC:  Follow up chronic problems.  History of Present Illness: Reports  thathe has been doing well. Denies recent fever or chills. Denies sinus pressure, nasal congestion , ear pain or sore throat. Denies chest congestion, or cough productive of sputum. Denies chest pain, palpitations, PND, orthopnea or leg swelling. Denies abdominal pain, nausea, vomitting, diarrhea or constipation. Denies change in bowel movements or bloody stool. Denies dysuria , frequency, incontinence or hesitancy. Denies  joint pain, swelling, or reduced mobility. Denies headaches, vertigo, seizures. Denies depression, anxiety or insomnia. Denies  rash, lesions, or itch. the pt has gained 11 pounds, is not exercising , but intends tto change this , as weklll as chenge his diet. no seizures since his last oV     Allergies: No Known Drug Allergies  Review of Systems      See HPI Eyes:  Denies blurring and discharge. MS:  Denies joint pain and stiffness. Neuro:  Denies headaches, seizures, sensation of room spinning, and tingling. Endo:  Denies cold intolerance, excessive hunger, excessive thirst, excessive urination, heat intolerance, polyuria, and weight change. Heme:  Denies abnormal bruising and bleeding. Allergy:  Denies hives or rash and sneezing.  Physical Exam  General:  Well-developedoverweight,in no acute distress; alert,appropriate and cooperative throughout examination HEENT: No  facial asymmetry,  EOMI, No sinus tenderness, TM's Clear, oropharynx  pink and moist.   Chest: Clear to auscultation bilaterally.  CVS: S1, S2, No murmurs, No S3.   Abd: Soft, Nontender.  MS: Adequate ROM spine, hips, shoulders and knees.  Ext: No edema.   CNS: CN 2-12 intact, power tone and sensation normal throughout.   Skin: Intact, no visible lesions or rashes.  Psych: Good eye contact, normal affect.  Memory intact, not anxious or depressed appearing.    Impression & Recommendations:  Problem # 1:  OBESITY, MILD (ICD-278.02) Assessment Deteriorated  Ht: 70 (03/19/2009)   Wt: 223 (03/19/2009)   BMI: 32.11 (03/19/2009)  Problem # 2:  SEIZURE DISORDER (ICD-780.39) Assessment: Unchanged  His updated medication list for this problem includes:    Dilantin 100 Mg Caps (Phenytoin sodium extended) .Marland Kitchen... Take four capsules by mouth at bedtime and 2 every am  Orders: T-Dilantin (Phenytoin) ([id number])  Problem # 3:  ESSENTIAL HYPERTENSION, BENIGN (ICD-401.1) Assessment: Unchanged  His updated medication list for this problem includes:    Hydrochlorothiazide 25 Mg Tabs (Hydrochlorothiazide) .Marland Kitchen... Take one tablet by mouth once a day  Orders: T-Basic Metabolic Panel [phone])  BP today: 112/80 Prior BP: 110/80 (11/14/2008)  Labs Reviewed: K+: 4.0 (11/14/2008) Creat: : 0.84 (11/14/2008)   Chol: 187 (11/14/2008)   HDL: 59 (11/14/2008)   LDL: 101 (11/14/2008)   TG: 135 (11/14/2008)  Problem # 4:  HYPERLIPIDEMIA (ICD-272.4) Assessment: Comment Only  His updated medication list for this problem includes:  Lovastatin 40 Mg Tabs (Lovastatin) ..... Increase to 2 tabs at bedtime  Orders: T-Hepatic Function [id number]) T-Lipid Profile [phone])  Labs Reviewed: SGOT: 24 (11/14/2008)   SGPT: 33 (11/14/2008)   HDL:59 (11/14/2008), 54 (07/11/2008)  LDL:101 (11/14/2008), 131 (07/11/2008)  Chol:187 (11/14/2008), 207 (07/11/2008)  Trig:135 (11/14/2008), 108  (07/11/2008)  Complete Medication List: 1)  Dilantin 100 Mg Caps (Phenytoin sodium extended) .... Take four capsules by mouth at bedtime and 2 every am 2)  Hydrochlorothiazide 25 Mg Tabs (Hydrochlorothiazide) .... Take one tablet by mouth once a day 3)  Lovastatin 40 Mg Tabs (Lovastatin) .... Increase to 2 tabs at bedtime 4)  Flonase 50 Mcg/act Susp (Fluticasone propionate) .Marland Kitchen.. 1 puff per nostril twice daily as needed  Patient Instructions: 1)  F/U in 5months 2)  It is important that you exercise regularly at least 30 minutes 5 times a week. If you develop chest pain, have severe difficulty breathing, or feel very tired , stop exercising immediately and seek medical attention. 3)  You need to lose weight. Consider a lower calorie diet and regular exercise. You need to, lose at least 12 pounds, you gasined this since Sept 4)  BMP prior to visit, ICD-9: 5)  Hepatic Panel prior to visit, ICD-9:  today pls 6)  Lipid Panel prior to visit, ICD-9: 7)  Dilantin level

## 2010-04-11 NOTE — Letter (Signed)
Summary: MEDICINE CHANGE  MEDICINE CHANGE   Imported By: Lind Guest 03/05/2010 13:00:49  _____________________________________________________________________  External Attachment:    Type:   Image     Comment:   External Document

## 2010-04-11 NOTE — Assessment & Plan Note (Signed)
Summary: PHY   Vital Signs:  Patient profile:   45 year old male Height:      70 inches Weight:      214.75 pounds BMI:     30.92 O2 Sat:      98 % on Room air Pulse rate:   87 / minute Pulse rhythm:   regular Resp:     16 per minute BP sitting:   112 / 80  (left arm)  Vitals Entered By: Adella Hare LPN (March 05, 2010 10:06 AM)  Nutrition Counseling: Patient's BMI is greater than 25 and therefore counseled on weight management options.  O2 Flow:  Room air CC: physical Is Patient Diabetic? No Pain Assessment Patient in pain? no       Vision Screening:Left eye w/o correction: 20 / 20 Right Eye w/o correction: 20 / 20 Both eyes w/o correction:  20/ 20        Vision Entered By: Adella Hare LPN (March 05, 2010 10:11 AM)   CC:  physical.  History of Present Illness: Reports  that he has been doing well. Denies recent fever or chills. Denies sinus pressure, nasal congestion , ear pain or sore throat. Denies chest congestion, or cough productive of sputum. Denies chest pain, palpitations, PND, orthopnea or leg swelling. Denies abdominal pain, nausea, vomitting, diarrhea or constipation. Denies change in bowel movements or bloody stool. Denies dysuria , frequency, incontinence or hesitancy.  Denies headaches, vertigo, seizures. Denies depression, anxiety or insomnia. Denies  rash, lesions, or itch.     Current Medications (verified): 1)  Dilantin 100 Mg  Caps (Phenytoin Sodium Extended) .... Take Four Capsules By Mouth At Bedtime and Three Every Morning 2)  Hydrochlorothiazide 25 Mg  Tabs (Hydrochlorothiazide) .... Take One Tablet By Mouth Once A Day 3)  Flonase 50 Mcg/act Susp (Fluticasone Propionate) .Marland Kitchen.. 1 Puff Per Nostril Twice Daily As Needed 4)  Lipitor 40 Mg Tabs (Atorvastatin Calcium) .... One Tab By Mouth Qhs  Allergies (verified): No Known Drug Allergies  Review of Systems      See HPI Eyes:  Denies blurring, discharge, double vision, eye  pain, and red eye. MS:  Complains of joint pain and joint redness; bilateral thumb pain and tenderness x 1 month. Neuro:  Complains of seizures; cfontrolled on meds, wants 90 day supply. Endo:  Denies cold intolerance, excessive hunger, excessive thirst, excessive urination, and polyuria. Heme:  Denies abnormal bruising, bleeding, enlarge lymph nodes, and fevers. Allergy:  Complains of seasonal allergies; mild.  Physical Exam  General:  Well-developed,well-nourished,in no acute distress; alert,appropriate and cooperative throughout examination Head:  Normocephalic and atraumatic without obvious abnormalities. No apparent alopecia or balding. Eyes:  No corneal or conjunctival inflammation noted. EOMI. Perrla. Funduscopic exam benign, without hemorrhages, exudates or papilledema. Vision grossly normal. Ears:  External ear exam shows no significant lesions or deformities.  Otoscopic examination reveals clear canals, tympanic membranes are intact bilaterally without bulging, retraction, inflammation or discharge. Hearing is grossly normal bilaterally. Nose:  External nasal examination shows no deformity or inflammation. Nasal mucosa are pink and moist without lesions or exudates. Mouth:  Oral mucosa and oropharynx without lesions or exudates.  Teeth in good repair.teeth missing.   Neck:  No deformities, masses, or tenderness noted. Chest Wall:  No deformities, masses, tenderness or gynecomastia noted. Breasts:  No masses or gynecomastia noted Lungs:  Normal respiratory effort, chest expands symmetrically. Lungs are clear to auscultation, no crackles or wheezes. Heart:  Normal rate and regular rhythm. S1 and  S2 normal without gallop, murmur, click, rub or other extra sounds. Abdomen:  Bowel sounds positive,abdomen soft and non-tender without masses, organomegaly or hernias noted. Rectal:  No external abnormalities noted. Normal sphincter tone. No rectal masses or tenderness. Genitalia:  Testes  bilaterally descended without nodularity, tenderness or masses. No scrotal masses or lesions. No penis lesions or urethral discharge. Prostate:  Prostate gland firm and smooth, no enlargement, nodularity, tenderness, mass, asymmetry or induration. Msk:  No deformity or scoliosis noted of thoracic or lumbar spine.   Pulses:  R and L carotid,radial,femoral,dorsalis pedis and posterior tibial pulses are full and equal bilaterally Extremities:  No clubbing, cyanosis, edema, or deformity noted with normal full range of motion of all joints.   Neurologic:  No cranial nerve deficits noted. Station and gait are normal. Plantar reflexes are down-going bilaterally. DTRs are symmetrical throughout. Sensory, motor and coordinative functions appear intact. Skin:  Intact without suspicious lesions or rashes Cervical Nodes:  No lymphadenopathy noted Axillary Nodes:  No palpable lymphadenopathy Inguinal Nodes:  No significant adenopathy Psych:  Cognition and judgment appear intact. Alert and cooperative with normal attention span and concentration. No apparent delusions, illusions, hallucinations   Complete Medication List: 1)  Hydrochlorothiazide 25 Mg Tabs (Hydrochlorothiazide) .... Take one tablet by mouth once a day 2)  Flonase 50 Mcg/act Susp (Fluticasone propionate) .Marland Kitchen.. 1 puff per nostril twice daily as needed 3)  Dilantin 100 Mg Caps (Phenytoin sodium extended) .... 3 capsules in the morning and 4 capsules at bedtime 4)  Phenytoin Sodium Extended 300 Mg Caps (Phenytoin sodium extended) .... Take 1 capsule by mouth once a day 5)  Phenytoin Sodium Extended 200 Mg Caps (Phenytoin sodium extended) .... Two capsules at bedtime 6)  Pravastatin Sodium 40 Mg Tabs (Pravastatin sodium) .... Two tablets at bedtime 7)  Ibuprofen 800 Mg Tabs (Ibuprofen) .... Take 1 tablet by mouth three times a day for thumb pain  Other Orders: T-Basic Metabolic Panel [phone]) T-Hepatic Function [id number]) T-Lipid  Profile [phone]) T-Dilantin (Phenytoin) [id number]) T-Basic Metabolic Panel [id number]) T-Hepatic Function [id number]) T-CBC w/Diff [id number]) T-Dilantin (Phenytoin) [id number]) T-PSA [id number]) T-Vitamin D (25-Hydroxy) [phone]) T-Lipid Profile [phone]) Hemoccult Guaiac-1 spec.(in office) (42706)  Patient Instructions: 1)  Follow up appointment in 5.1months  after August 30, 2010 2)  It is important that you exercise regularly at least 20 minutes 5 times a week. If you develop chest pain, have severe difficulty breathing, or feel very tired , stop exercising immediately and seek medical attention. 3)  You need to lose weight. Consider a lower calorie diet and regular exercise.  4)  BMP prior to visit, ICD-9: 5)  Hepatic Panel prior to visit, ICD-9: 6)  Lipid Panel prior to visit, ICD-9:  today 7)  dilantin 8)  MED is sent locally for thumb pain 9)  BMP prior to visit, ICD-9: 10)  Hepatic Panel prior to visit, ICD-9: 11)  Lipid Panel prior to visit, ICD-9: 12)  CBC w/ Diff prior to visit, ICD-9:  afsting June 20 , 2012 or after 13)  PSA prior to visit, ICD-9: 14)  dilantin 15)  vit D level  16)  pLS start one OTC vit D tablet of VitD3 once daily, 800IU or 100IU and one multivitamin daily Prescriptions: IBUPROFEN 800 MG TABS (IBUPROFEN) Take 1 tablet by mouth three times a day for thumb pain  #30 x 0   Entered and Authorized by:   Syliva Overman MD   Signed by:   Syliva Overman MD on 03/05/2010  Method used:   Electronically to        [facility] [phone]* (retail)       [address]       [place], [place]  [address]       Ph: [phone] or [phone]       Fax: [phone]   RxID:   [id number] PRAVASTATIN SODIUM 40 MG TABS (PRAVASTATIN SODIUM) two tablets at bedtime  #180 x 1   Entered and Authorized by:   Syliva Overman MD   Signed by:   Syliva Overman MD on 03/05/2010   Method used:   Printed then faxed to ...        [facility] (mail-order)             , [place]         Ph: [phone]       Fax: [phone]   RxID:   [id number] PHENYTOIN SODIUM EXTENDED 200 MG CAPS (PHENYTOIN SODIUM EXTENDED) two capsules at bedtime  #180 x 3   Entered and Authorized by:   Syliva Overman MD   Signed by:   Syliva Overman MD on 03/05/2010   Method used:   Printed then faxed to ...       [facility] (mail-order)             , [place]         Ph: [phone]       Fax: [phone]   RxID:   [id number] PHENYTOIN SODIUM EXTENDED 300 MG CAPS (PHENYTOIN SODIUM EXTENDED) Take 1 capsule by mouth once a day  #90 x 3   Entered and Authorized by:   Syliva Overman MD   Signed by:   Syliva Overman MD on 03/05/2010   Method used:   Printed then faxed to ...       [facility] (mail-order)             , [place]         Ph: [phone]       Fax: [phone]   RxID:   [id number] DILANTIN 100 MG CAPS (PHENYTOIN SODIUM EXTENDED) 3 capsules in the morning and 4 capsules at bedtime  #630 x 3   Entered and Authorized by:   Syliva Overman MD   Signed by:   Syliva Overman MD on 03/05/2010   Method used:   Printed then faxed to ...       [facility] (mail-order)             , [place]         Ph: [phone]       Fax: [phone]   RxID:   [id number]    Orders Added: 1)  Est. Patient 40-64 years [99396] 2)  T-Basic Metabolic Panel [phone] 3)  T-Hepatic Function [phone] 4)  T-Lipid Profile [[id number]] 5)  T-Dilantin (Phenytoin) [34193-79024] 6)  T-Basic Metabolic Panel [[id number]] 7)  T-Hepatic Function [[id number]] 8)  T-CBC w/Diff [[id number]] 9)  T-Dilantin (Phenytoin) [[id number]] 10)  T-PSA [94174-08144] 11)  T-Vitamin D (25-Hydroxy) [81856-31497] 12)  T-Lipid Profile [[id number]] 13)  Hemoccult Guaiac-1 spec.(in office) [82270]    Laboratory Results  Date/Time Received: March 05, 2010 10:41 AM  Date/Time Reported: March 05, 2010 10:41 AM   Stool - Occult Blood Hemmoccult #1:  negative Date: 03/05/2010 Comments: 50201 10L 02/13 118 10/12 Adella Hare LPN  March 05, 2010 10:42 AM

## 2010-06-06 ENCOUNTER — Encounter: Payer: Self-pay | Admitting: Family Medicine

## 2010-06-11 ENCOUNTER — Ambulatory Visit (INDEPENDENT_AMBULATORY_CARE_PROVIDER_SITE_OTHER): Payer: BC Managed Care – PPO | Admitting: Family Medicine

## 2010-06-11 ENCOUNTER — Encounter: Payer: Self-pay | Admitting: Family Medicine

## 2010-06-11 VITALS — BP 120/80 | HR 98 | Resp 16 | Ht 70.75 in | Wt 217.1 lb

## 2010-06-11 DIAGNOSIS — I10 Essential (primary) hypertension: Secondary | ICD-10-CM

## 2010-06-11 DIAGNOSIS — G40909 Epilepsy, unspecified, not intractable, without status epilepticus: Secondary | ICD-10-CM

## 2010-06-11 DIAGNOSIS — J309 Allergic rhinitis, unspecified: Secondary | ICD-10-CM

## 2010-06-11 DIAGNOSIS — E785 Hyperlipidemia, unspecified: Secondary | ICD-10-CM

## 2010-06-11 MED ORDER — LORATADINE 10 MG PO TABS: 10.0000 mg | ORAL_TABLET | Freq: Every day | ORAL | Status: DC

## 2010-06-11 NOTE — Patient Instructions (Signed)
F/u in 4 to 5 months. It is important that you exercise regularly at least 30 minutes 5 times a week. If you develop chest pain, have severe difficulty breathing, or feel very tired, stop exercising immediately and seek medical attention  You need to lose weight. Please consider a lower calorie diet and regular exercise  Fasting chem 7, lipid, hepatic and dilantin level as soon as possible.  No med changes at this time, will review your labs Allergy med sent to your pharmacy You may take sudafed one tablet daily for the next 4 to 5 days for your allergies

## 2010-06-12 LAB — LIPID PANEL
LDL Cholesterol: 110 mg/dL — ABNORMAL HIGH (ref 0–99)
VLDL: 23 mg/dL (ref 0–40)

## 2010-06-12 LAB — BASIC METABOLIC PANEL
BUN: 11 mg/dL (ref 6–23)
CO2: 28 mEq/L (ref 19–32)
Chloride: 99 mEq/L (ref 96–112)
Glucose, Bld: 99 mg/dL (ref 70–99)
Potassium: 3.9 mEq/L (ref 3.5–5.3)

## 2010-06-12 LAB — HEPATIC FUNCTION PANEL
Bilirubin, Direct: 0.1 mg/dL (ref 0.0–0.3)
Indirect Bilirubin: 0.3 mg/dL (ref 0.0–0.9)
Total Bilirubin: 0.4 mg/dL (ref 0.3–1.2)

## 2010-06-15 ENCOUNTER — Encounter: Payer: Self-pay | Admitting: Family Medicine

## 2010-06-15 NOTE — Progress Notes (Signed)
  Subjective:    Patient ID: Reginald Gonzales, male    DOB: Feb 06, 1966, 45 y.o.   MRN: [id number]  HPI The PT is here for follow up and re-evaluation of chronic medical conditions, medication management and review of recent lab and radiology data.  Preventive health is updated, specifically  Cancer screening, Osteoporosis screening and Immunization.   Questions or concerns regarding consultations or procedures which the PT has had in the interim are  addressed. The PT denies any adverse reactions to current medications since the last visit.  There are no new concerns.  He is requesting completionof  a drivers license. He has had no seizures in over 4 years, and remains compliant with his medication.      Review of Systems    Denies recent fever or chills. Denies sinus pressure, nasal congestion, ear pain or sore throat. Denies chest congestion, productive cough or wheezing. Denies chest pains, palpitations, paroxysmal nocturnal dyspnea, orthopnea and leg swelling Denies abdominal pain, nausea, vomiting,diarrhea or constipation.  Denies rectal bleeding or change in bowel movement. Denies dysuria, frequency, hesitancy or incontinence. Denies joint pain, swelling and limitation and mobility. Denies headaches, seizure, numbness, or tingling. Denies depression, anxiety or insomnia. Denies skin break down or rash.     Objective:   Physical Exam    Patient alert and oriented and in no Cardiopulmonary distress.  HEENT: No facial asymmetry, EOMI, no sinus tenderness, TM's clear, Oropharynx pink and moist.  Neck supple no adenopathy.  Chest: Clear to auscultation bilaterally.  CVS: S1, S2 no murmurs, no S3.  ABD: Soft non tender. Bowel sounds normal.  Ext: No edema  MS: Adequate ROM spine, shoulders, hips and knees.  Skin: Intact, no ulcerations or rash noted.  Psych: Good eye contact, normal affect. Memory intact not anxious or depressed appearing.  CNS: CN 2-12 intact,  power, tone and sensation normal throughout.     Assessment & Plan:  Hypertension:Controlled, no changes in medication.  Seizure d/o: stable, check med level Hyperlipidemia ; Hyperlipidemia:Low fat diet discussed and encouraged. Continue meds.Obesity: unchanged, lifestyle change encouraged and discussed to facilitate weight loss Form completed for driver's license

## 2010-07-10 ENCOUNTER — Other Ambulatory Visit: Payer: Self-pay | Admitting: Family Medicine

## 2010-07-10 MED ORDER — PHENYTOIN SODIUM EXTENDED 100 MG PO CAPS: 100.0000 mg | ORAL_CAPSULE | Freq: Two times a day (BID) | ORAL | Status: DC

## 2010-08-09 ENCOUNTER — Other Ambulatory Visit: Payer: Self-pay | Admitting: Family Medicine

## 2010-08-27 ENCOUNTER — Encounter: Payer: Self-pay | Admitting: Family Medicine

## 2010-08-30 ENCOUNTER — Encounter: Payer: Self-pay | Admitting: Family Medicine

## 2010-08-30 ENCOUNTER — Ambulatory Visit (INDEPENDENT_AMBULATORY_CARE_PROVIDER_SITE_OTHER): Payer: BC Managed Care – PPO | Admitting: Family Medicine

## 2010-08-30 VITALS — BP 130/80 | HR 79 | Resp 16 | Ht 70.5 in | Wt 218.8 lb

## 2010-08-30 DIAGNOSIS — E663 Overweight: Secondary | ICD-10-CM

## 2010-08-30 DIAGNOSIS — I10 Essential (primary) hypertension: Secondary | ICD-10-CM

## 2010-08-30 DIAGNOSIS — Z125 Encounter for screening for malignant neoplasm of prostate: Secondary | ICD-10-CM

## 2010-08-30 DIAGNOSIS — E785 Hyperlipidemia, unspecified: Secondary | ICD-10-CM

## 2010-08-30 DIAGNOSIS — R5381 Other malaise: Secondary | ICD-10-CM

## 2010-08-30 DIAGNOSIS — R5383 Other fatigue: Secondary | ICD-10-CM

## 2010-08-30 DIAGNOSIS — R569 Unspecified convulsions: Secondary | ICD-10-CM

## 2010-08-30 LAB — CBC WITH DIFFERENTIAL/PLATELET
Basophils Absolute: 0 10*3/uL (ref 0.0–0.1)
Eosinophils Relative: 4 % (ref 0–5)
HCT: 45.3 % (ref 39.0–52.0)
Lymphocytes Relative: 41 % (ref 12–46)
MCHC: 34.4 g/dL (ref 30.0–36.0)
MCV: 85.6 fL (ref 78.0–100.0)
Monocytes Absolute: 0.5 10*3/uL (ref 0.1–1.0)
RDW: 13.2 % (ref 11.5–15.5)
WBC: 4.4 10*3/uL (ref 4.0–10.5)

## 2010-08-30 LAB — TSH: TSH: 1.976 u[IU]/mL (ref 0.350–4.500)

## 2010-08-30 LAB — BASIC METABOLIC PANEL
BUN: 13 mg/dL (ref 6–23)
Chloride: 99 mEq/L (ref 96–112)
Creat: 0.81 mg/dL (ref 0.50–1.35)

## 2010-08-30 LAB — LIPID PANEL
LDL Cholesterol: 106 mg/dL — ABNORMAL HIGH (ref 0–99)
Triglycerides: 107 mg/dL (ref <150)

## 2010-08-30 MED ORDER — PRAVASTATIN SODIUM 40 MG PO TABS: ORAL_TABLET | ORAL | Status: DC

## 2010-08-30 MED ORDER — PHENYTOIN SODIUM EXTENDED 100 MG PO CAPS: ORAL_CAPSULE | ORAL | Status: DC

## 2010-08-30 NOTE — Patient Instructions (Signed)
CPE early January.  It is important that you exercise regularly at least 30 minutes 5 times a week. If you develop chest pain, have severe difficulty breathing, or feel very tired, stop exercising immediately and seek medical attention   A healthy diet is rich in fruit, vegetables and whole grains. Poultry fish, nuts and beans are a healthy choice for protein rather then red meat. A low sodium diet and drinking 64 ounces of water daily is generally recommended. Oils and sweet should be limited. Carbohydrates especially for those who are diabetic or overweight, should be limited to 34-45 gram per meal. It is important to eat on a regular schedule, at least 3 times daily. Snacks should be primarily fruits, vegetables or nuts.  Fasting labs today, cb, chem 7, lipid, hepatic, dilantin, tsh, psa and dilantin   Fasting lipid,hepatic,chem 7, dilantin early Jan  Call for flu vaccine in Fall please

## 2010-08-30 NOTE — Assessment & Plan Note (Signed)
Deteriorated. Patient re-educated about  the importance of commitment to a  minimum of 150 minutes of exercise per week. The importance of healthy food choices with portion control discussed. Encouraged to start a food diary, count calories and to consider  joining a support group. Sample diet sheets offered. Goals set by the patient for the next several months.    

## 2010-08-30 NOTE — Assessment & Plan Note (Signed)
Controlled, no change in medication  

## 2010-08-30 NOTE — Progress Notes (Signed)
  Subjective:    Patient ID: Reginald Gonzales, male    DOB: October 28, 1965, 45 y.o.   MRN: [id number]  HPI The PT is here for follow up and re-evaluation of chronic medical conditions, medication management and review of recent lab and radiology data.  Preventive health is updated, specifically  Cancer screening, and Immunization.   Questions or concerns regarding consultations or procedures which the PT has had in the interim are  addressed. The PT denies any adverse reactions to current medications since the last visit.  There are no new concerns.  There are no specific complaints       Review of Systems Denies recent fever or chills. Denies sinus pressure, nasal congestion, ear pain or sore throat. Denies chest congestion, productive cough or wheezing. Denies chest pains, palpitations, paroxysmal nocturnal dyspnea, orthopnea and leg swelling Denies abdominal pain, nausea, vomiting,diarrhea or constipation.  Denies rectal bleeding or change in bowel movement. Denies dysuria, frequency, hesitancy or incontinence. Denies joint pain, swelling and limitation in mobility. Denies headaches, seizure, numbness, or tingling. Denies depression, anxiety or insomnia. Denies skin break down or rash.        Objective:   Physical Exam Patient alert and oriented and in no Cardiopulmonary distress.  HEENT: No facial asymmetry, EOMI, no sinus tenderness, TM's clear, Oropharynx pink and moist.  Neck supple no adenopathy.  Chest: Clear to auscultation bilaterally.  CVS: S1, S2 no murmurs, no S3.  ABD: Soft non tender. Bowel sounds normal.  Ext: No edema  MS: Adequate ROM spine, shoulders, hips and knees.  Skin: Intact, no ulcerations or rash noted.  Psych: Good eye contact, normal affect. Memory intact not anxious or depressed appearing.  CNS: CN 2-12 intact, power, tone and sensation normal throughout.        Assessment & Plan:

## 2010-08-30 NOTE — Assessment & Plan Note (Signed)
Uncontrolled, LDL elevated, no med change at this time

## 2010-11-15 ENCOUNTER — Ambulatory Visit: Payer: BC Managed Care – PPO | Admitting: Family Medicine

## 2011-03-10 ENCOUNTER — Encounter: Payer: Self-pay | Admitting: Family Medicine

## 2011-03-19 ENCOUNTER — Ambulatory Visit (INDEPENDENT_AMBULATORY_CARE_PROVIDER_SITE_OTHER): Payer: BC Managed Care – PPO | Admitting: Family Medicine

## 2011-03-19 ENCOUNTER — Encounter: Payer: Self-pay | Admitting: Family Medicine

## 2011-03-19 VITALS — BP 126/100 | HR 100 | Resp 18 | Ht 70.5 in | Wt 219.1 lb

## 2011-03-19 DIAGNOSIS — J309 Allergic rhinitis, unspecified: Secondary | ICD-10-CM

## 2011-03-19 DIAGNOSIS — R569 Unspecified convulsions: Secondary | ICD-10-CM

## 2011-03-19 DIAGNOSIS — Z23 Encounter for immunization: Secondary | ICD-10-CM

## 2011-03-19 DIAGNOSIS — I10 Essential (primary) hypertension: Secondary | ICD-10-CM

## 2011-03-19 DIAGNOSIS — E785 Hyperlipidemia, unspecified: Secondary | ICD-10-CM

## 2011-03-19 DIAGNOSIS — Z0000 Encounter for general adult medical examination without abnormal findings: Secondary | ICD-10-CM

## 2011-03-19 MED ORDER — LORATADINE 10 MG PO TABS: 10.0000 mg | ORAL_TABLET | Freq: Every day | ORAL | Status: DC

## 2011-03-19 MED ORDER — PHENYTOIN SODIUM EXTENDED 100 MG PO CAPS: ORAL_CAPSULE | ORAL | Status: DC

## 2011-03-19 MED ORDER — HYDROCHLOROTHIAZIDE 25 MG PO TABS: 25.0000 mg | ORAL_TABLET | Freq: Every day | ORAL | Status: DC

## 2011-03-19 MED ORDER — PRAVASTATIN SODIUM 40 MG PO TABS: ORAL_TABLET | ORAL | Status: DC

## 2011-03-19 NOTE — Patient Instructions (Signed)
F/U in 6 months.  Fasting labs are due , please do as soon as possible.  It is important that you exercise regularly at least 30 minutes 5 times a week. If you develop chest pain, have severe difficulty breathing, or feel very tired, stop exercising immediately and seek medical attention    A healthy diet is rich in fruit, vegetables and whole grains. Poultry fish, nuts and beans are a healthy choice for protein rather then red meat. A low sodium diet and drinking 64 ounces of water daily is generally recommended. Oils and sweet should be limited. Carbohydrates especially for those who are diabetic or overweight, should be limited to 34-45 gram per meal. It is important to eat on a regular schedule, at least 3 times daily. Snacks should be primarily fruits, vegetables or nuts.  No changes in medication at this time.  TdAP today, also EKg is normal

## 2011-03-23 NOTE — Assessment & Plan Note (Signed)
Controlled, no change in medication Check level of med in blood in the near future

## 2011-03-23 NOTE — Assessment & Plan Note (Signed)
Hyperlipidemia:Low fat diet discussed and encouraged.  Updated labs needed 

## 2011-03-23 NOTE — Assessment & Plan Note (Signed)
Controlled, no change in medication  

## 2011-03-23 NOTE — Progress Notes (Signed)
  Subjective:    Patient ID: Reginald Gonzales, male    DOB: November 05, 1965, 46 y.o.   MRN: [id number]  HPI The PT is here for annual exam  and re-evaluation of chronic medical conditions, medication management and review of any available recent lab and radiology data.  Preventive health is updated, specifically  Cancer screening and Immunization.   Questions or concerns regarding consultations or procedures which the PT has had in the interim are  addressed. The PT denies any adverse reactions to current medications since the last visit.  There are no new concerns.  There are no specific complaints       Review of Systems See HPI Denies recent fever or chills. Denies sinus pressure, nasal congestion, ear pain or sore throat. Denies chest congestion, productive cough or wheezing. Denies chest pains, palpitations and leg swelling Denies abdominal pain, nausea, vomiting,diarrhea or constipation.   Denies dysuria, frequency, hesitancy or incontinence. Denies joint pain, swelling and limitation in mobility. Denies headaches, seizures, numbness, or tingling. Denies depression, anxiety or insomnia. Denies skin break down or rash.        Objective:   Physical Exam Pleasant well nourished male, alert and oriented x 3, in no cardio-pulmonary distress. Afebrile. HEENT No facial trauma or asymetry. Sinuses non tender. EOMI, PERTL, fundoscopic exam is negative for hemorhages or exudates. External ears normal, tympanic membranes clear. Oropharynx moist, no exudate, fair  dentition. Neck: supple, no adenopathy,JVD or thyromegaly.No bruits.  Chest: Clear to ascultation bilaterally.No crackles or wheezes. Non tender to palpation  Breast: No asymetry,no masses. No nipple discharge or inversion. No axillary or supraclavicular adenopathy  Cardiovascular system; Heart sounds normal,  S1 and  S2 ,no S3.  No murmur, or thrill. Apical beat not displaced Peripheral pulses  normal.  Abdomen: Soft, non tender, no organomegaly or masses. No bruits. Bowel sounds normal. No guarding, tenderness or rebound.  Rectal:  No mass. guaiac negative stool. Prostate smooth and firm    Musculoskeletal exam: Full ROM of spine, hips , shoulders and knees. No deformity ,swelling or crepitus noted. No muscle wasting or atrophy.   Neurologic: Cranial nerves 2 to 12 intact. Power, tone ,sensation and reflexes normal throughout. No disturbance in gait. No tremor.  Skin: Intact, no ulceration, erythema , scaling or rash noted. Pigmentation normal throughout  Psych; Normal mood and affect. Judgement and concentration normal         Assessment & Plan:

## 2011-04-01 ENCOUNTER — Encounter: Payer: Self-pay | Admitting: Family Medicine

## 2011-09-13 ENCOUNTER — Other Ambulatory Visit: Payer: Self-pay | Admitting: Family Medicine

## 2011-09-13 LAB — HEPATIC FUNCTION PANEL
ALT: 31 U/L (ref 0–53)
Alkaline Phosphatase: 68 U/L (ref 39–117)
Indirect Bilirubin: 0.3 mg/dL (ref 0.0–0.9)
Total Protein: 7 g/dL (ref 6.0–8.3)

## 2011-09-13 LAB — BASIC METABOLIC PANEL
BUN: 10 mg/dL (ref 6–23)
Creat: 0.79 mg/dL (ref 0.50–1.35)

## 2011-09-13 LAB — LIPID PANEL
Cholesterol: 180 mg/dL (ref 0–200)
Triglycerides: 123 mg/dL (ref <150)

## 2011-09-16 ENCOUNTER — Ambulatory Visit: Payer: BC Managed Care – PPO | Admitting: Family Medicine

## 2011-09-19 ENCOUNTER — Ambulatory Visit (INDEPENDENT_AMBULATORY_CARE_PROVIDER_SITE_OTHER): Payer: BC Managed Care – PPO | Admitting: Family Medicine

## 2011-09-19 ENCOUNTER — Encounter: Payer: Self-pay | Admitting: Family Medicine

## 2011-09-19 VITALS — BP 130/92 | HR 85 | Resp 16 | Ht 70.5 in | Wt 223.1 lb

## 2011-09-19 DIAGNOSIS — I10 Essential (primary) hypertension: Secondary | ICD-10-CM

## 2011-09-19 DIAGNOSIS — Z125 Encounter for screening for malignant neoplasm of prostate: Secondary | ICD-10-CM

## 2011-09-19 DIAGNOSIS — M899 Disorder of bone, unspecified: Secondary | ICD-10-CM

## 2011-09-19 DIAGNOSIS — E785 Hyperlipidemia, unspecified: Secondary | ICD-10-CM

## 2011-09-19 DIAGNOSIS — E663 Overweight: Secondary | ICD-10-CM

## 2011-09-19 DIAGNOSIS — R5381 Other malaise: Secondary | ICD-10-CM

## 2011-09-19 DIAGNOSIS — R569 Unspecified convulsions: Secondary | ICD-10-CM

## 2011-09-19 NOTE — Patient Instructions (Addendum)
CPE January 10 or after.  Blood pressure is a little high, cut back on salt, canned foods and sodas, please increase fruit and vegetable fresh or frozen and drink water   It is important that you exercise regularly at least 45 minutes 5 times a week. If you develop chest pain, have severe difficulty breathing, or feel very tired, stop exercising immediately and seek medical attention   Weight loss goal of 10 pounds in the next 6 month   Labs in the next week non fasting, cbc, TSh, Vit D, PSA, hBA1C   Fasting labs in January lipid, cmp and dilantin level  Continue current medication  Medication will be refilled x 6 month

## 2011-09-20 NOTE — Progress Notes (Signed)
  Subjective:    Patient ID: Reginald Gonzales, male    DOB: [birth year], 46 y.o.   MRN: [id number]  HPI The PT is here for follow up and re-evaluation of chronic medical conditions, medication management and review of any available recent lab and radiology data.  Preventive health is updated, specifically  Cancer screening and Immunization.   Questions or concerns regarding consultations or procedures which the PT has had in the interim are  addressed. The PT denies any adverse reactions to current medications since the last visit.  There are no new concerns.  There are no specific complaints       Review of Systems See HPI Denies recent fever or chills. Denies sinus pressure, nasal congestion, ear pain or sore throat. Denies chest congestion, productive cough or wheezing. Denies chest pains, palpitations and leg swelling Denies abdominal pain, nausea, vomiting,diarrhea or constipation.   Denies dysuria, frequency, hesitancy or incontinence. Denies joint pain, swelling and limitation in mobility. Denies headaches, seizures, numbness, or tingling. Denies depression, anxiety or insomnia. Denies skin break down or rash.        Objective:   Physical Exam  Patient alert and oriented and in no cardiopulmonary distress.  HEENT: No facial asymmetry, EOMI, no sinus tenderness,  oropharynx pink and moist.  Neck supple no adenopathy.  Chest: Clear to auscultation bilaterally.  CVS: S1, S2 no murmurs, no S3.  ABD: Soft non tender. Bowel sounds normal.  Ext: No edema  MS: Adequate ROM spine, shoulders, hips and knees.  Skin: Intact, no ulcerations or rash noted.  Psych: Good eye contact, normal affect. Memory intact not anxious or depressed appearing.  CNS: CN 2-12 intact, power, tone and sensation normal throughout.       Assessment & Plan:

## 2011-09-20 NOTE — Assessment & Plan Note (Signed)
Controlled , no seizure for over 3 years. Recent dilantin level slightly sub optimal, the importance of adherence to treatment stressed

## 2011-09-20 NOTE — Assessment & Plan Note (Signed)
Improved though LDL still elevated. Hyperlipidemia:Low fat diet discussed and encouraged.  No change in med

## 2011-09-20 NOTE — Assessment & Plan Note (Signed)
Deteriorated. Patient re-educated about  the importance of commitment to a  minimum of 150 minutes of exercise per week. The importance of healthy food choices with portion control discussed. Encouraged to start a food diary, count calories and to consider  joining a support group. Sample diet sheets offered. Goals set by the patient for the next several months.    

## 2011-09-20 NOTE — Assessment & Plan Note (Signed)
Diastolic pressure high, no med change, lowering salt intake, weight loss and commitment to exercise stressed

## 2011-09-26 LAB — CBC WITH DIFFERENTIAL/PLATELET
Basophils Absolute: 0 10*3/uL (ref 0.0–0.1)
Basophils Relative: 1 % (ref 0–1)
HCT: 42.7 % (ref 39.0–52.0)
Lymphocytes Relative: 43 % (ref 12–46)
MCH: 29.5 pg (ref 26.0–34.0)
MCV: 81.8 fL (ref 78.0–100.0)
Monocytes Relative: 12 % (ref 3–12)
Neutro Abs: 1.7 10*3/uL (ref 1.7–7.7)
Neutrophils Relative %: 39 % — ABNORMAL LOW (ref 43–77)
RBC: 5.22 MIL/uL (ref 4.22–5.81)
RDW: 13.6 % (ref 11.5–15.5)

## 2011-09-26 LAB — TSH: TSH: 2.28 u[IU]/mL (ref 0.350–4.500)

## 2011-09-26 LAB — HEMOGLOBIN A1C: Mean Plasma Glucose: 120 mg/dL — ABNORMAL HIGH (ref <117)

## 2011-09-26 LAB — PSA: PSA: 0.79 ng/mL (ref <=4.00)

## 2011-09-27 LAB — VITAMIN D 25 HYDROXY (VIT D DEFICIENCY, FRACTURES): Vit D, 25-Hydroxy: 22 ng/mL — ABNORMAL LOW (ref 30–89)

## 2011-10-03 ENCOUNTER — Other Ambulatory Visit: Payer: Self-pay

## 2011-10-03 MED ORDER — PHENYTOIN SODIUM EXTENDED 100 MG PO CAPS: ORAL_CAPSULE | ORAL | Status: DC

## 2011-10-03 MED ORDER — PRAVASTATIN SODIUM 40 MG PO TABS: ORAL_TABLET | ORAL | Status: DC

## 2011-10-03 MED ORDER — HYDROCHLOROTHIAZIDE 25 MG PO TABS: 25.0000 mg | ORAL_TABLET | Freq: Every day | ORAL | Status: DC

## 2012-03-08 ENCOUNTER — Telehealth: Payer: Self-pay | Admitting: Family Medicine

## 2012-03-09 MED ORDER — PRAVASTATIN SODIUM 40 MG PO TABS: ORAL_TABLET | ORAL | Status: DC

## 2012-03-09 MED ORDER — HYDROCHLOROTHIAZIDE 25 MG PO TABS: 25.0000 mg | ORAL_TABLET | Freq: Every day | ORAL | Status: DC

## 2012-03-09 MED ORDER — PHENYTOIN SODIUM EXTENDED 100 MG PO CAPS: ORAL_CAPSULE | ORAL | Status: DC

## 2012-03-09 MED ORDER — LORATADINE 10 MG PO TABS: 10.0000 mg | ORAL_TABLET | Freq: Every day | ORAL | Status: DC

## 2012-03-09 NOTE — Telephone Encounter (Signed)
Sent in

## 2012-03-24 ENCOUNTER — Ambulatory Visit (INDEPENDENT_AMBULATORY_CARE_PROVIDER_SITE_OTHER): Payer: BC Managed Care – PPO | Admitting: Family Medicine

## 2012-03-24 ENCOUNTER — Encounter: Payer: Self-pay | Admitting: Family Medicine

## 2012-03-24 VITALS — BP 122/84 | HR 102 | Resp 16 | Ht 70.5 in | Wt 221.0 lb

## 2012-03-24 DIAGNOSIS — I10 Essential (primary) hypertension: Secondary | ICD-10-CM

## 2012-03-24 DIAGNOSIS — Z0000 Encounter for general adult medical examination without abnormal findings: Secondary | ICD-10-CM

## 2012-03-24 DIAGNOSIS — E663 Overweight: Secondary | ICD-10-CM

## 2012-03-24 DIAGNOSIS — R5381 Other malaise: Secondary | ICD-10-CM

## 2012-03-24 DIAGNOSIS — Z139 Encounter for screening, unspecified: Secondary | ICD-10-CM

## 2012-03-24 DIAGNOSIS — R7301 Impaired fasting glucose: Secondary | ICD-10-CM

## 2012-03-24 DIAGNOSIS — R5383 Other fatigue: Secondary | ICD-10-CM

## 2012-03-24 DIAGNOSIS — Z1211 Encounter for screening for malignant neoplasm of colon: Secondary | ICD-10-CM

## 2012-03-24 DIAGNOSIS — R569 Unspecified convulsions: Secondary | ICD-10-CM

## 2012-03-24 DIAGNOSIS — E785 Hyperlipidemia, unspecified: Secondary | ICD-10-CM

## 2012-03-24 LAB — POC HEMOCCULT BLD/STL (OFFICE/1-CARD/DIAGNOSTIC): Fecal Occult Blood, POC: NEGATIVE

## 2012-03-24 NOTE — Patient Instructions (Addendum)
F/u in 5 month  You need to commit to daily exercise for 30 minutes for improved energy and improvement in your health  You need to decrease the portion sizes of food you eat so that you lose weight. This will help to prevent you from becoming a diabetic  You need to set a 10 o clock bedtime so that you get sufficient rest, this will also improve your health  You need to take daily vit D3 over the counter, 800IU one daily. Your vit D level is low, getting it normal will improve your energy  Labs today, hBA1C, dilantin level , cmp and B12 level  Please get the flu vaccine at the pharmacy , you need this

## 2012-03-25 LAB — COMPREHENSIVE METABOLIC PANEL
Albumin: 4.7 g/dL (ref 3.5–5.2)
CO2: 27 mEq/L (ref 19–32)
Calcium: 9.4 mg/dL (ref 8.4–10.5)
Chloride: 101 mEq/L (ref 96–112)
Glucose, Bld: 70 mg/dL (ref 70–99)
Potassium: 3.9 mEq/L (ref 3.5–5.3)
Sodium: 140 mEq/L (ref 135–145)
Total Protein: 7.6 g/dL (ref 6.0–8.3)

## 2012-03-28 DIAGNOSIS — Z0000 Encounter for general adult medical examination without abnormal findings: Secondary | ICD-10-CM | POA: Insufficient documentation

## 2012-03-28 NOTE — Assessment & Plan Note (Signed)
Hyperlipidemia:Low fat diet discussed and encouraged.  Controlled, no change in medication   

## 2012-03-28 NOTE — Assessment & Plan Note (Signed)
Controlled, no change in medication DASH diet and commitment to daily physical activity for a minimum of 30 minutes discussed and encouraged, as a part of hypertension management. The importance of attaining a healthy weight is also discussed.  

## 2012-03-28 NOTE — Assessment & Plan Note (Signed)
Physical exam completed as documented. The importance of commitent to daily physical activity and dietary change to promote health and weight loss is emphasized.  I also added this will be important in addressing chronic fatigue Needs to get flu vaccine

## 2012-03-28 NOTE — Progress Notes (Signed)
  Subjective:    Patient ID: Reginald Gonzales, male    DOB: 15-Mar-1965, 47 y.o.   MRN: [id number]  HPI Pt in for his annual exam C/o fatigue , denies chest pain, palpitations, PNd or orthopnea. No commitment to regular physical activity, has lost a few pounds, needs to lose more. Denies any recent seizures. C/o mild anxiety with poor sleep at times, due to financial challenges    Review of Systems See HPI Denies recent fever or chills. Denies sinus pressure, nasal congestion, ear pain or sore throat. Denies chest congestion, productive cough or wheezing. Denies chest pains, palpitations and leg swelling Denies abdominal pain, nausea, vomiting,diarrhea or constipation.   Denies dysuria, frequency, hesitancy or incontinence. Denies joint pain, swelling and limitation in mobility. Denies headaches, seizures, numbness, or tingling.  Denies skin break down or rash.        Objective:   Physical Exam  Pleasant well nourished male, alert and oriented x 3, in no cardio-pulmonary distress. Afebrile. HEENT No facial trauma or asymetry. Sinuses non tender. EOMI, PERTL, fundoscopic exam is negative for hemorhages or exudates. External ears normal, tympanic membranes clear. Oropharynx moist, no exudate, fairly good dentition. Neck: supple, no adenopathy,JVD or thyromegaly.No bruits.  Chest: Clear to ascultation bilaterally.No crackles or wheezes. Non tender to palpation  Breast: No asymetry,no masses. No nipple discharge or inversion. No axillary or supraclavicular adenopathy  Cardiovascular system; Heart sounds normal,  S1 and  S2 ,no S3.  No murmur, or thrill. Apical beat not displaced Peripheral pulses normal.  Abdomen: Soft, non tender, no organomegaly or masses. No bruits. Bowel sounds normal. No guarding, tenderness or rebound.  Rectal:  No mass. guaiac negative stool. Prostate smooth and firm, no nodules, not enlarged    Musculoskeletal exam: Full ROM of  spine, hips , shoulders and knees. No deformity ,swelling or crepitus noted. No muscle wasting or atrophy.   Neurologic: Cranial nerves 2 to 12 intact. Power, tone ,sensation and reflexes normal throughout. No disturbance in gait. No tremor.  Skin: Intact, no ulceration, erythema , scaling or rash noted. Pigmentation normal throughout  Psych; Normal mood and affect. Judgement and concentration normal        Assessment & Plan:

## 2012-03-28 NOTE — Assessment & Plan Note (Signed)
Improved. Pt applauded on succesful weight loss through lifestyle change, and encouraged to continue same. Weight loss goal set for the next several months.  

## 2012-03-28 NOTE — Assessment & Plan Note (Signed)
Controlled, no change in medication Obtain drug level every 6 monht

## 2012-03-28 NOTE — Assessment & Plan Note (Signed)
Check B12 and tSH. And hBa1C Lifestyle change the with weight loss a major goal Depression screen is negative, though pt has mild anxiety which is appropriate and not excessive

## 2012-04-28 NOTE — Addendum Note (Signed)
Addended by: Abner Greenspan on: 04/28/2012 09:39 AM   Modules accepted: Orders

## 2012-07-07 ENCOUNTER — Emergency Department (HOSPITAL_COMMUNITY)
Admission: EM | Admit: 2012-07-07 | Discharge: 2012-07-07 | Disposition: A | Discharge status: Home / Self Care | Payer: BC Managed Care – PPO | Attending: Emergency Medicine | Admitting: Emergency Medicine

## 2012-07-07 ENCOUNTER — Encounter (HOSPITAL_COMMUNITY): Payer: Self-pay

## 2012-07-07 ENCOUNTER — Emergency Department (HOSPITAL_COMMUNITY): Payer: BC Managed Care – PPO

## 2012-07-07 DIAGNOSIS — E785 Hyperlipidemia, unspecified: Secondary | ICD-10-CM | POA: Insufficient documentation

## 2012-07-07 DIAGNOSIS — E669 Obesity, unspecified: Secondary | ICD-10-CM | POA: Insufficient documentation

## 2012-07-07 DIAGNOSIS — R000 Tachycardia, unspecified: Secondary | ICD-10-CM | POA: Insufficient documentation

## 2012-07-07 DIAGNOSIS — R059 Cough, unspecified: Secondary | ICD-10-CM | POA: Insufficient documentation

## 2012-07-07 DIAGNOSIS — IMO0002 Reserved for concepts with insufficient information to code with codable children: Secondary | ICD-10-CM | POA: Insufficient documentation

## 2012-07-07 DIAGNOSIS — I10 Essential (primary) hypertension: Secondary | ICD-10-CM | POA: Insufficient documentation

## 2012-07-07 DIAGNOSIS — R05 Cough: Secondary | ICD-10-CM | POA: Insufficient documentation

## 2012-07-07 DIAGNOSIS — J3489 Other specified disorders of nose and nasal sinuses: Secondary | ICD-10-CM | POA: Insufficient documentation

## 2012-07-07 DIAGNOSIS — G40909 Epilepsy, unspecified, not intractable, without status epilepticus: Secondary | ICD-10-CM | POA: Insufficient documentation

## 2012-07-07 DIAGNOSIS — Z87448 Personal history of other diseases of urinary system: Secondary | ICD-10-CM | POA: Insufficient documentation

## 2012-07-07 DIAGNOSIS — R51 Headache: Secondary | ICD-10-CM | POA: Insufficient documentation

## 2012-07-07 DIAGNOSIS — J159 Unspecified bacterial pneumonia: Secondary | ICD-10-CM | POA: Insufficient documentation

## 2012-07-07 DIAGNOSIS — J189 Pneumonia, unspecified organism: Secondary | ICD-10-CM

## 2012-07-07 DIAGNOSIS — Z79899 Other long term (current) drug therapy: Secondary | ICD-10-CM | POA: Insufficient documentation

## 2012-07-07 MED ORDER — AZITHROMYCIN 250 MG PO TABS: 250.0000 mg | ORAL_TABLET | Freq: Every day | ORAL | Status: DC

## 2012-07-07 MED ORDER — GUAIFENESIN-CODEINE 100-10 MG/5ML PO SYRP: 5.0000 mL | ORAL_SOLUTION | Freq: Three times a day (TID) | ORAL | Status: DC | PRN

## 2012-07-07 NOTE — ED Notes (Signed)
Pt c/o sore throat x 1 week.  Denies fever, reports productive cough.

## 2012-07-07 NOTE — ED Notes (Signed)
Pt alert & oriented x4, stable gait. Patient given discharge instructions, paperwork & prescription(s). Patient  instructed to stop at the registration desk to finish any additional paperwork. Patient verbalized understanding. Pt left department w/ no further questions. 

## 2012-07-07 NOTE — ED Provider Notes (Signed)
History     CSN: [id number]  Arrival date & time 07/07/12  1844   None     Chief Complaint  Patient presents with  . Sore Throat    (Consider location/radiation/quality/duration/timing/severity/associated sxs/prior treatment) HPI Reginald Gonzales is a 47 y.o. male who presents to the ED with sore throat. The sore throat started over a week ago. Associated symptoms include productive cough, headache, congestion. He denies fever, nausea or vomiting. He has not taken anything for the symptoms. The history was provided by the patient.   Past Medical History  Diagnosis Date  . Hypertension   . Hyperlipidemia   . Seizure disorder   . Obesity     mild  . Acute epididymitis     History reviewed. No pertinent past surgical history.  Family History  Problem Relation Age of Onset  . Hypertension Mother   . Heart disease Mother   . Hypertension Father     History  Substance Use Topics  . Smoking status: Never Smoker   . Smokeless tobacco: Not on file  . Alcohol Use: No      Review of Systems  Constitutional: Negative for fever and chills.  HENT: Positive for congestion and sore throat. Negative for neck pain.   Respiratory: Positive for cough. Negative for shortness of breath and wheezing.   Cardiovascular: Negative for chest pain and palpitations.  Gastrointestinal: Negative for nausea, vomiting and abdominal pain.  Skin: Negative for rash.  Neurological: Negative for dizziness, syncope and headaches.  Psychiatric/Behavioral: The patient is not nervous/anxious.     Allergies  Review of patient's allergies indicates no known allergies.  Home Medications   Current Outpatient Rx  Name  Route  Sig  Dispense  Refill  . fluticasone (FLONASE) 50 MCG/ACT nasal spray   Nasal   1 spray by Nasal route 2 (two) times daily.           . hydrochlorothiazide (HYDRODIURIL) 25 MG tablet   Oral   Take 1 tablet (25 mg total) by mouth daily.   90 tablet   2   . ibuprofen  (ADVIL,MOTRIN) 800 MG tablet   Oral   Take 800 mg by mouth 3 (three) times daily as needed. For thumb pain          . loratadine (CLARITIN) 10 MG tablet   Oral   Take 1 tablet (10 mg total) by mouth daily.   90 tablet   2   . phenytoin (DILANTIN) 100 MG ER capsule      3 capsules in the morning and 4 capsules at bedtime   630 capsule   2   . pravastatin (PRAVACHOL) 40 MG tablet      2 tabs at bedtime   180 tablet   2     BP 143/87  Pulse 102  Temp(Src) 98.8 F (37.1 C) (Oral)  Resp 20  Ht 5\' 9"  (1.753 m)  Wt 223 lb (101.152 kg)  BMI 32.92 kg/m2  SpO2 100%  Physical Exam  Nursing note and vitals reviewed. Constitutional: He is oriented to person, place, and time. He appears well-developed and well-nourished. No distress.  HENT:  Head: Normocephalic and atraumatic.  Right Ear: Tympanic membrane normal.  Left Ear: Tympanic membrane normal.  Nose: Rhinorrhea present.  Mouth/Throat: Uvula is midline and mucous membranes are normal. Posterior oropharyngeal erythema present.  Eyes: EOM are normal.  Neck: Neck supple.  Cardiovascular: Tachycardia present.   Pulmonary/Chest: Effort normal. He has no wheezes. He has  rales (occasional right).  Productive cough  Abdominal: Soft. There is no tenderness.  Musculoskeletal: Normal range of motion.  Neurological: He is alert and oriented to person, place, and time. No cranial nerve deficit.  Skin: Skin is warm and dry.  Psychiatric: He has a normal mood and affect. His behavior is normal. Judgment and thought content normal.   Results for orders placed during the hospital encounter of 07/07/12 (from the past 24 hour(s))  RAPID STREP SCREEN     Status: None   Collection Time    07/07/12  6:52 PM      Result Value Range   Streptococcus, Group A Screen (Direct) NEGATIVE  NEGATIVE   Dg Chest 2 View  07/07/2012  *RADIOLOGY REPORT*  Clinical Data: Productive cough.  CHEST - 2 VIEW  Comparison: None.  Findings: Two-view exam  shows a right infrahilar density, suspicious for pneumonia.  Left lung is clear.  Cardiopericardial silhouette is within normal limits for size.  Lateral view is motion degraded. Imaged bony structures of the thorax are intact.  IMPRESSION: Question right infrahilar pneumonia.  Follow-up recommended to ensure resolution.   Original Report Authenticated By: Kennith Center, M.D.     ED Course  Procedures (including critical care time) Assessment: 47 y.o. male with productive cough   Pneumonia Plan:  Antibiotics, cough medication   Follow up with PCP, return here as needed for problems.  MDM  I have reviewed this patient's vital signs, nurses notes, appropriate imaging and discussed findings with the patient and plan of care. Patient voices understanding.    Medication List    TAKE these medications       azithromycin 250 MG tablet  Commonly known as:  ZITHROMAX Z-PAK  Take 1 tablet (250 mg total) by mouth daily.     guaiFENesin-codeine 100-10 MG/5ML syrup  Commonly known as:  ROBITUSSIN AC  Take 5 mLs by mouth 3 (three) times daily as needed for cough.      ASK your doctor about these medications       FLONASE 50 MCG/ACT nasal spray  Generic drug:  fluticasone  1 spray by Nasal route 2 (two) times daily.     hydrochlorothiazide 25 MG tablet  Commonly known as:  HYDRODIURIL  Take 1 tablet (25 mg total) by mouth daily.     loratadine 10 MG tablet  Commonly known as:  CLARITIN  Take 1 tablet (10 mg total) by mouth daily.     phenytoin 100 MG ER capsule  Commonly known as:  DILANTIN  Take 300-400 mg by mouth 2 (two) times daily. Patient takes 3 capsules in the morning and 4 capsules at night     pravastatin 40 MG tablet  Commonly known as:  PRAVACHOL  2 tabs at bedtime                 Janne Napoleon, NP 07/07/12 2052

## 2012-07-07 NOTE — ED Provider Notes (Signed)
Medical screening examination/treatment/procedure(s) were performed by non-physician practitioner and as supervising physician I was immediately available for consultation/collaboration.  Donnetta Hutching, MD 07/07/12 [phone]

## 2012-08-01 LAB — HEMOGLOBIN A1C: Hgb A1c MFr Bld: 5.5 % (ref <5.7)

## 2012-08-05 ENCOUNTER — Ambulatory Visit (INDEPENDENT_AMBULATORY_CARE_PROVIDER_SITE_OTHER): Payer: BC Managed Care – PPO | Admitting: Family Medicine

## 2012-08-05 ENCOUNTER — Encounter: Payer: Self-pay | Admitting: Family Medicine

## 2012-08-05 VITALS — BP 126/84 | HR 82 | Resp 18 | Ht 70.5 in | Wt 221.1 lb

## 2012-08-05 DIAGNOSIS — E8881 Metabolic syndrome: Secondary | ICD-10-CM

## 2012-08-05 DIAGNOSIS — E785 Hyperlipidemia, unspecified: Secondary | ICD-10-CM

## 2012-08-05 DIAGNOSIS — R7302 Impaired glucose tolerance (oral): Secondary | ICD-10-CM

## 2012-08-05 DIAGNOSIS — R569 Unspecified convulsions: Secondary | ICD-10-CM

## 2012-08-05 DIAGNOSIS — E663 Overweight: Secondary | ICD-10-CM

## 2012-08-05 DIAGNOSIS — R7309 Other abnormal glucose: Secondary | ICD-10-CM

## 2012-08-05 DIAGNOSIS — Z125 Encounter for screening for malignant neoplasm of prostate: Secondary | ICD-10-CM

## 2012-08-05 DIAGNOSIS — I10 Essential (primary) hypertension: Secondary | ICD-10-CM

## 2012-08-05 NOTE — Progress Notes (Signed)
  Subjective:    Patient ID: Reginald Gonzales, male    DOB: 1966-02-19, 47 y.o.   MRN: [id number]  HPI  The PT is here for follow up and re-evaluation of chronic medical conditions, medication management and review of any available recent lab and radiology data.  Preventive health is updated, specifically  Cancer screening and Immunization.   Was treated in April for CAP via ED, now asymptomatic The PT denies any adverse reactions to current medications since the last visit.  There are no new concerns.  There are no specific complaints  Denies any seizure activity since last visit     Review of Systems See HPI Denies recent fever or chills. Denies sinus pressure, nasal congestion, ear pain or sore throat. Denies chest congestion, productive cough or wheezing. Denies chest pains, palpitations and leg swelling Denies abdominal pain, nausea, vomiting,diarrhea or constipation.   Denies dysuria, frequency, hesitancy or incontinence. Denies joint pain, swelling and limitation in mobility. Denies headaches, seizures, numbness, or tingling. Denies depression, anxiety or insomnia. Denies skin break down or rash.        Objective:   Physical Exam  Patient alert and oriented and in no cardiopulmonary distress.  HEENT: No facial asymmetry, EOMI, no sinus tenderness,  oropharynx pink and moist.  Neck supple no adenopathy.  Chest: Clear to auscultation bilaterally.  CVS: S1, S2 no murmurs, no S3.  ABD: Soft non tender. Bowel sounds normal.  Ext: No edema  MS: Adequate ROM spine, shoulders, hips and knees.  Skin: Intact, no ulcerations or rash noted.  Psych: Good eye contact, normal affect. Memory intact not anxious or depressed appearing.  CNS: CN 2-12 intact, power, tone and sensation normal throughout.       Assessment & Plan:

## 2012-08-05 NOTE — Patient Instructions (Addendum)
F/u in 6 month, call if you need me before  Fasting lipid, cmp , dilantin, pSA, tSH and cBC in 6 month  It is important that you exercise regularly at least 30 minutes 5 times a week. If you develop chest pain, have severe difficulty breathing, or feel very tired, stop exercising immediately and seek medical attention   A healthy diet is rich in fruit, vegetables and whole grains. Poultry fish, nuts and beans are a healthy choice for protein rather then red meat. A low sodium diet and drinking 64 ounces of water daily is generally recommended. Oils and sweet should be limited. Carbohydrates especially for those who are diabetic or overweight, should be limited to 30-45 gram per meal. It is important to eat on a regular schedule, at least 3 times daily. Snacks should be primarily fruits, vegetables or nuts.  Blood sugar now normal, great

## 2012-08-06 MED ORDER — HYDROCHLOROTHIAZIDE 25 MG PO TABS: 25.0000 mg | ORAL_TABLET | Freq: Every day | ORAL | Status: DC

## 2012-08-07 DIAGNOSIS — E8881 Metabolic syndrome: Secondary | ICD-10-CM | POA: Insufficient documentation

## 2012-08-07 DIAGNOSIS — R7302 Impaired glucose tolerance (oral): Secondary | ICD-10-CM | POA: Insufficient documentation

## 2012-08-07 NOTE — Assessment & Plan Note (Signed)
Unchanged. Patient re-educated about  the importance of commitment to a  minimum of 150 minutes of exercise per week. The importance of healthy food choices with portion control discussed. Encouraged to start a food diary, count calories and to consider  joining a support group. Sample diet sheets offered. Goals set by the patient for the next several months.    

## 2012-08-07 NOTE — Assessment & Plan Note (Signed)
Pt at increased CV risk and is aware of the need to modify lifestyle to address this

## 2012-08-07 NOTE — Assessment & Plan Note (Signed)
Denies recent seizure activity, updated lab needed

## 2012-08-07 NOTE — Assessment & Plan Note (Signed)
Improved, most recent HBA1C is normal, pt applauded on this

## 2012-08-07 NOTE — Assessment & Plan Note (Signed)
Controlled, no change in medication DASH diet and commitment to daily physical activity for a minimum of 30 minutes discussed and encouraged, as a part of hypertension management. The importance of attaining a healthy weight is also discussed.  

## 2012-08-07 NOTE — Assessment & Plan Note (Signed)
Hyperlipidemia:Low fat diet discussed and encouraged.  Updated lab needed 

## 2012-08-24 NOTE — Addendum Note (Signed)
Addended by: Kandis Fantasia B on: 08/24/2012 09:43 AM   Modules accepted: Orders

## 2012-12-15 ENCOUNTER — Telehealth: Payer: Self-pay | Admitting: Family Medicine

## 2012-12-15 MED ORDER — PHENYTOIN SODIUM EXTENDED 100 MG PO CAPS: ORAL_CAPSULE | ORAL | Status: DC

## 2012-12-15 NOTE — Telephone Encounter (Signed)
rx sent

## 2013-02-01 ENCOUNTER — Ambulatory Visit: Payer: BC Managed Care – PPO | Admitting: Family Medicine

## 2013-03-08 ENCOUNTER — Ambulatory Visit: Payer: BC Managed Care – PPO | Admitting: Family Medicine

## 2013-03-28 ENCOUNTER — Ambulatory Visit (INDEPENDENT_AMBULATORY_CARE_PROVIDER_SITE_OTHER): Payer: BC Managed Care – PPO | Admitting: Family Medicine

## 2013-03-28 ENCOUNTER — Encounter (INDEPENDENT_AMBULATORY_CARE_PROVIDER_SITE_OTHER): Payer: Self-pay

## 2013-03-28 ENCOUNTER — Encounter: Payer: Self-pay | Admitting: Family Medicine

## 2013-03-28 VITALS — BP 124/78 | HR 97 | Resp 18 | Ht 70.5 in | Wt 233.0 lb

## 2013-03-28 DIAGNOSIS — Z1211 Encounter for screening for malignant neoplasm of colon: Secondary | ICD-10-CM

## 2013-03-28 DIAGNOSIS — R5381 Other malaise: Secondary | ICD-10-CM

## 2013-03-28 DIAGNOSIS — Z23 Encounter for immunization: Secondary | ICD-10-CM

## 2013-03-28 DIAGNOSIS — R5383 Other fatigue: Secondary | ICD-10-CM

## 2013-03-28 DIAGNOSIS — R7302 Impaired glucose tolerance (oral): Secondary | ICD-10-CM

## 2013-03-28 DIAGNOSIS — R569 Unspecified convulsions: Secondary | ICD-10-CM

## 2013-03-28 DIAGNOSIS — Z125 Encounter for screening for malignant neoplasm of prostate: Secondary | ICD-10-CM

## 2013-03-28 DIAGNOSIS — E785 Hyperlipidemia, unspecified: Secondary | ICD-10-CM

## 2013-03-28 DIAGNOSIS — R7309 Other abnormal glucose: Secondary | ICD-10-CM

## 2013-03-28 DIAGNOSIS — I10 Essential (primary) hypertension: Secondary | ICD-10-CM

## 2013-03-28 DIAGNOSIS — E8881 Metabolic syndrome: Secondary | ICD-10-CM

## 2013-03-28 DIAGNOSIS — E663 Overweight: Secondary | ICD-10-CM

## 2013-03-28 DIAGNOSIS — Z1212 Encounter for screening for malignant neoplasm of rectum: Secondary | ICD-10-CM

## 2013-03-28 LAB — POC HEMOCCULT BLD/STL (OFFICE/1-CARD/DIAGNOSTIC): Fecal Occult Blood, POC: NEGATIVE

## 2013-03-28 MED ORDER — HYDROCHLOROTHIAZIDE 25 MG PO TABS: 25.0000 mg | ORAL_TABLET | Freq: Every day | ORAL | Status: DC

## 2013-03-28 MED ORDER — PHENYTOIN SODIUM EXTENDED 100 MG PO CAPS: ORAL_CAPSULE | ORAL | Status: DC

## 2013-03-28 MED ORDER — PRAVASTATIN SODIUM 40 MG PO TABS: ORAL_TABLET | ORAL | Status: DC

## 2013-03-28 NOTE — Assessment & Plan Note (Signed)
Stable and controlled by history, updated lab today

## 2013-03-28 NOTE — Assessment & Plan Note (Signed)
The increased risk of cardiovascular disease associated with this diagnosis, and the need to consistently work on lifestyle to change this is discussed. Following  a  heart healthy diet ,commitment to 30 minutes of exercise at least 5 days per week, as well as control of blood sugar and cholesterol , and achieving a healthy weight are all the areas to be addressed .  

## 2013-03-28 NOTE — Assessment & Plan Note (Signed)
Deteriorated. Patient re-educated about  the importance of commitment to a  minimum of 150 minutes of exercise per week. The importance of healthy food choices with portion control discussed. Encouraged to start a food diary, count calories and to consider  joining a support group. Sample diet sheets offered. Goals set by the patient for the next several months.    

## 2013-03-28 NOTE — Progress Notes (Signed)
   Subjective:    Patient ID: Reginald Gonzales, male    DOB: 09-19-1965, 48 y.o.   MRN: [id number]  HPI The PT is here for follow up and re-evaluation of chronic medical conditions, medication management and review of any available recent lab and radiology data.  Preventive health is updated, specifically  Cancer screening and Immunization.   . The PT denies any adverse reactions to current medications since the last visit.  There are no new concerns. States he is concerned about ongoing weight gain, needs to work mainly on diet , he has an active job There are no specific complaints , denies any seizure activity since last visit      Review of Systems See HPI Denies recent fever or chills. Denies sinus pressure, nasal congestion, ear pain or sore throat. Denies chest congestion, productive cough or wheezing. Denies chest pains, palpitations and leg swelling Denies abdominal pain, nausea, vomiting,diarrhea or constipation.   Denies dysuria, frequency, hesitancy or incontinence. Denies joint pain, swelling and limitation in mobility. Denies headaches, seizures, numbness, or tingling. Denies depression, anxiety or insomnia. Denies skin break down or rash.        Objective:   Physical Exam  Patient alert and oriented and in no cardiopulmonary distress.  HEENT: No facial asymmetry, EOMI, no sinus tenderness,  oropharynx pink and moist.  Neck supple no adenopathy.TM clear bilaterally  Chest: Clear to auscultation bilaterally.  CVS: S1, S2 no murmurs, no S3.  ABD: Soft non tender. Bowel sounds normal.No organomegaly or mass Rectal: no mass, heme negative stool, prostate not enlarged, smooth and firm Ext: No edema  MS: Adequate ROM spine, shoulders, hips and knees.  Skin: Intact, no ulcerations or rash noted.  Psych: Good eye contact, normal affect. Memory intact not anxious or depressed appearing.  CNS: CN 2-12 intact, power, tone and sensation normal  throughout.       Assessment & Plan:

## 2013-03-28 NOTE — Assessment & Plan Note (Signed)
Patient educated about the importance of limiting  Carbohydrate intake , the need to commit to daily physical activity for a minimum of 30 minutes , and to commit weight loss. The fact that changes in all these areas will reduce or eliminate all together the development of diabetes is stressed.   Updated lab today

## 2013-03-28 NOTE — Assessment & Plan Note (Signed)
Hyperlipidemia:Low fat diet discussed and encouraged.  Updated lab today 

## 2013-03-28 NOTE — Assessment & Plan Note (Signed)
Controlled, no change in medication DASH diet and commitment to daily physical activity for a minimum of 30 minutes discussed and encouraged, as a part of hypertension management. The importance of attaining a healthy weight is also discussed.  

## 2013-03-28 NOTE — Patient Instructions (Addendum)
F/u in 6 month, call if you need me before  CBC, lipid, cmp , HBA1C, PSA  phenytoin level do today when you leave  It is important that you exercise regularly at least 30 minutes 5 times a week. If you develop chest pain, have severe difficulty breathing, or feel very tired, stop exercising immediately and seek medical attention   Work ion weight loss.You have gained 12 pounds Rectal exam today is normal Flu vaccine today

## 2013-03-29 ENCOUNTER — Other Ambulatory Visit: Payer: Self-pay | Admitting: Family Medicine

## 2013-03-29 LAB — PSA: PSA: 0.77 ng/mL (ref <=4.00)

## 2013-03-29 LAB — COMPREHENSIVE METABOLIC PANEL
ALBUMIN: 4.4 g/dL (ref 3.5–5.2)
ALK PHOS: 78 U/L (ref 39–117)
ALT: 33 U/L (ref 0–53)
AST: 24 U/L (ref 0–37)
BUN: 14 mg/dL (ref 6–23)
CO2: 30 mEq/L (ref 19–32)
Calcium: 9.9 mg/dL (ref 8.4–10.5)
Chloride: 100 mEq/L (ref 96–112)
Creat: 0.78 mg/dL (ref 0.50–1.35)
Glucose, Bld: 86 mg/dL (ref 70–99)
POTASSIUM: 4.2 meq/L (ref 3.5–5.3)
Sodium: 139 mEq/L (ref 135–145)
Total Bilirubin: 0.3 mg/dL (ref 0.3–1.2)
Total Protein: 7.7 g/dL (ref 6.0–8.3)

## 2013-03-29 LAB — CBC
HCT: 42 % (ref 39.0–52.0)
HEMOGLOBIN: 15.3 g/dL (ref 13.0–17.0)
MCH: 29.3 pg (ref 26.0–34.0)
MCHC: 36.4 g/dL — AB (ref 30.0–36.0)
MCV: 80.5 fL (ref 78.0–100.0)
Platelets: 152 10*3/uL (ref 150–400)
RBC: 5.22 MIL/uL (ref 4.22–5.81)
RDW: 14.1 % (ref 11.5–15.5)
WBC: 5.4 10*3/uL (ref 4.0–10.5)

## 2013-03-29 LAB — HEMOGLOBIN A1C
Hgb A1c MFr Bld: 6.1 % — ABNORMAL HIGH (ref <5.7)
MEAN PLASMA GLUCOSE: 128 mg/dL — AB (ref <117)

## 2013-03-29 LAB — LIPID PANEL
CHOL/HDL RATIO: 4.9 ratio
Cholesterol: 227 mg/dL — ABNORMAL HIGH (ref 0–200)
HDL: 46 mg/dL (ref >39)
LDL Cholesterol: 127 mg/dL — ABNORMAL HIGH (ref 0–99)
Triglycerides: 268 mg/dL — ABNORMAL HIGH (ref <150)
VLDL: 54 mg/dL — AB (ref 0–40)

## 2013-03-29 LAB — PHENYTOIN LEVEL, TOTAL: Phenytoin Lvl: 8.4 ug/mL — ABNORMAL LOW (ref 10.0–20.0)

## 2013-03-29 MED ORDER — PHENYTOIN SODIUM EXTENDED 100 MG PO CAPS: ORAL_CAPSULE | ORAL | Status: DC

## 2013-03-29 NOTE — Addendum Note (Signed)
Addended by: Denman George B on: 03/29/2013 04:43 PM   Modules accepted: Orders

## 2013-05-02 ENCOUNTER — Other Ambulatory Visit: Payer: Self-pay

## 2013-05-02 MED ORDER — PHENYTOIN SODIUM EXTENDED 100 MG PO CAPS: ORAL_CAPSULE | ORAL | Status: DC

## 2013-09-26 ENCOUNTER — Ambulatory Visit: Payer: BC Managed Care – PPO | Admitting: Family Medicine

## 2013-10-03 ENCOUNTER — Other Ambulatory Visit: Payer: Self-pay

## 2013-10-03 MED ORDER — HYDROCHLOROTHIAZIDE 25 MG PO TABS: 25.0000 mg | ORAL_TABLET | Freq: Every day | ORAL | Status: DC

## 2013-10-03 MED ORDER — PRAVASTATIN SODIUM 40 MG PO TABS: ORAL_TABLET | ORAL | Status: DC

## 2013-10-25 ENCOUNTER — Ambulatory Visit: Payer: BC Managed Care – PPO | Admitting: Family Medicine

## 2013-11-02 IMAGING — CR DG CHEST 2V
2 series · 2 of 2 positions shown · non-contrast
Comparison: None.

CLINICAL DATA: Productive cough.

CHEST - 2 VIEW

[view not recorded (1 of 2)]
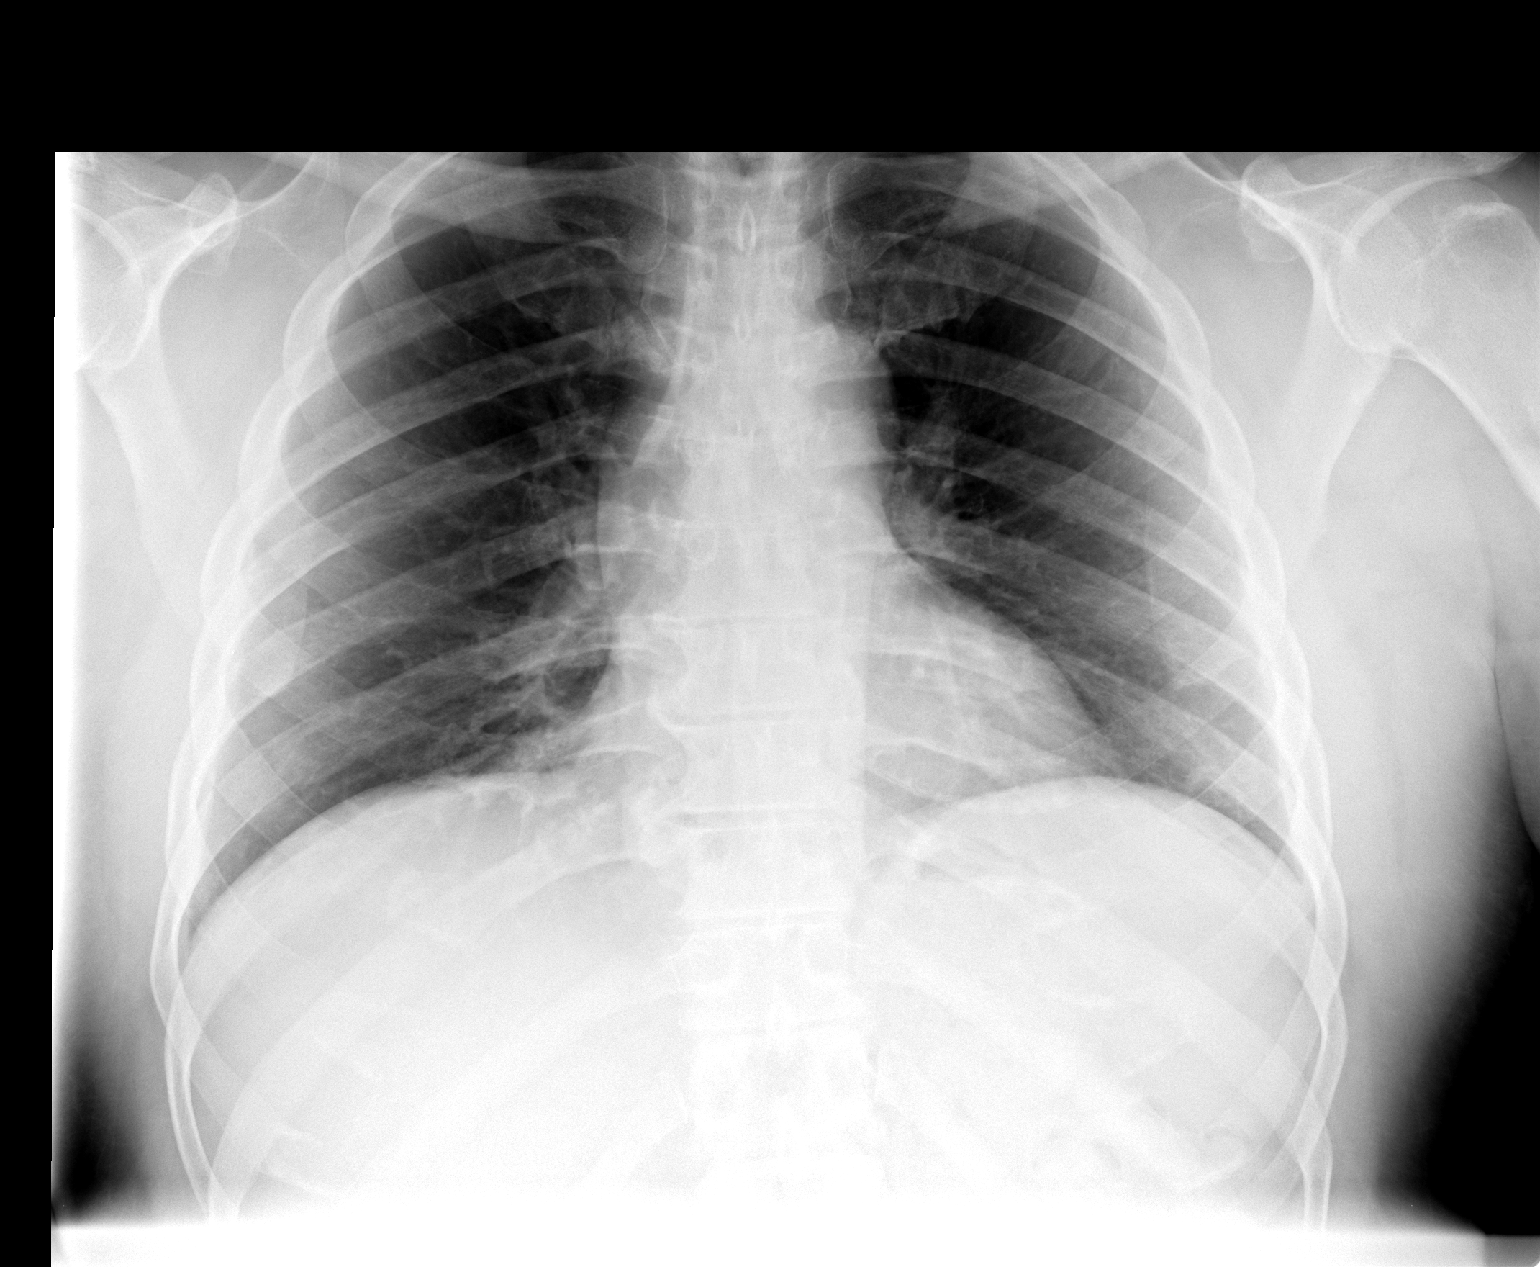

[view not recorded (2 of 2)]
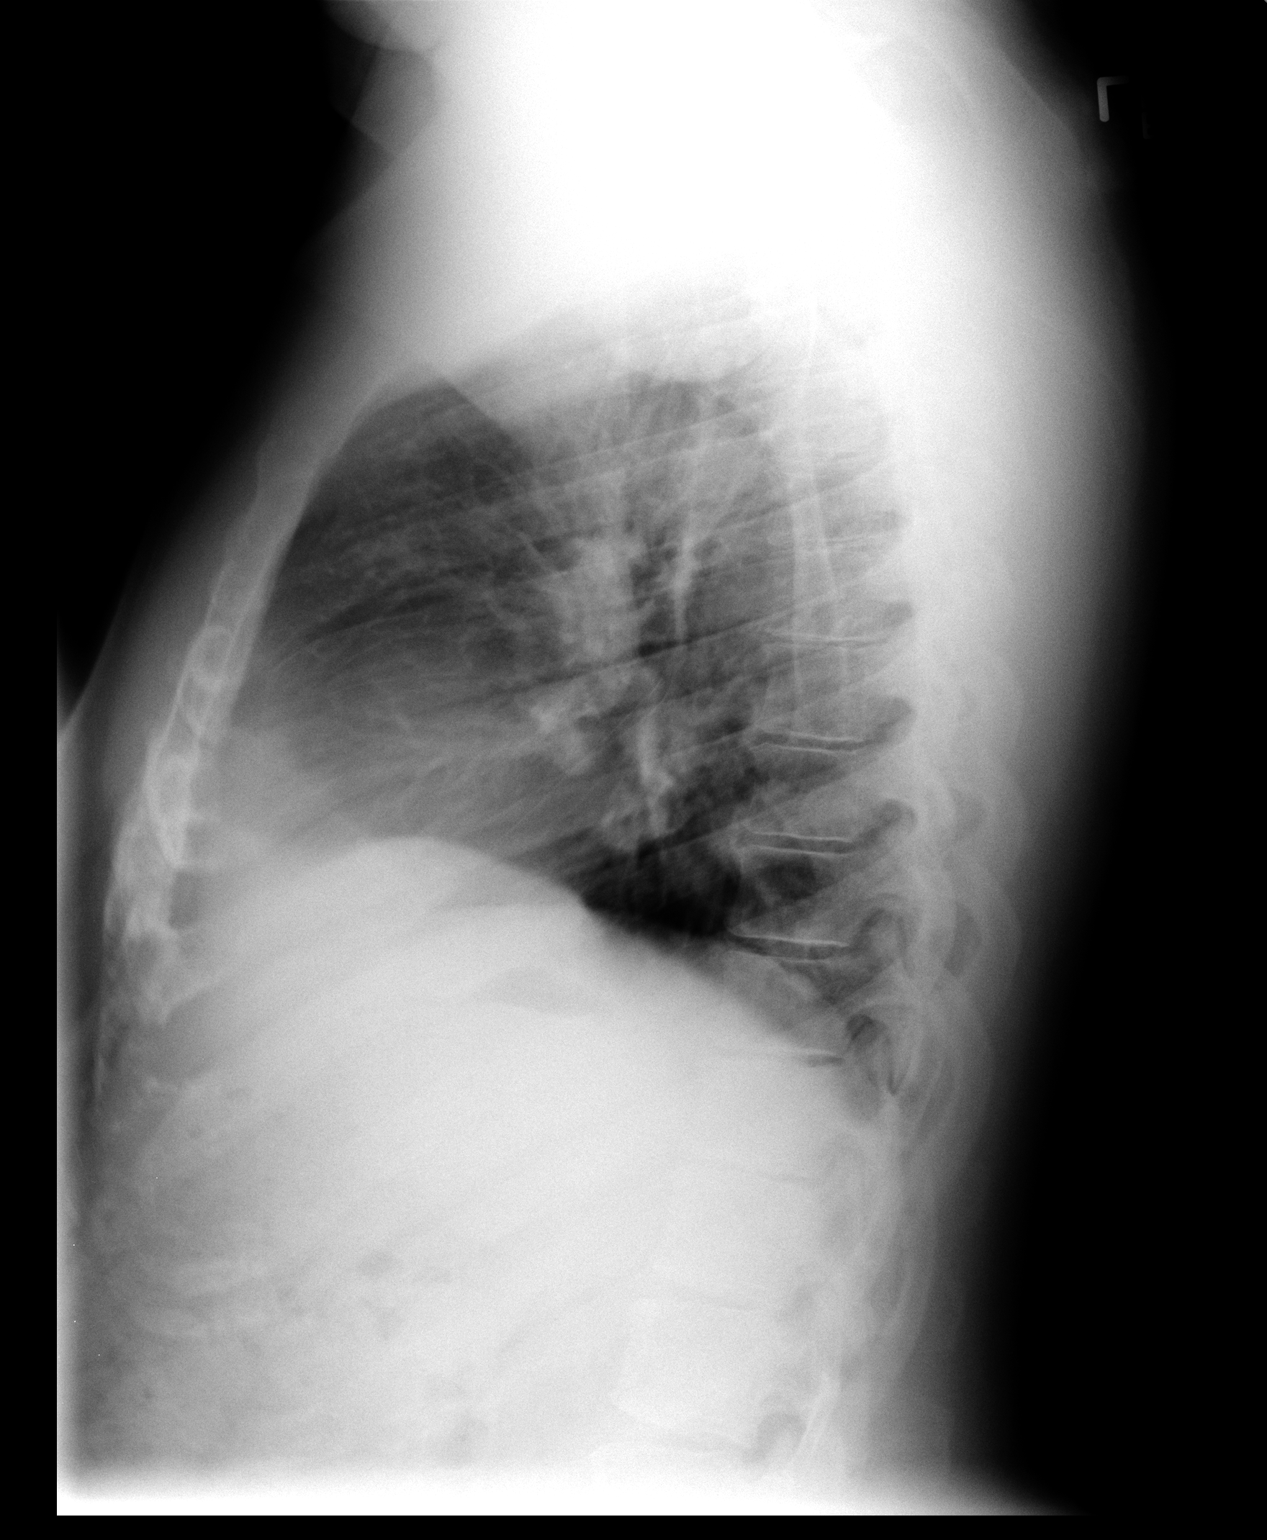

[2 of 2 positions shown; findings below may reference images not displayed]

FINDINGS: Two-view exam shows a right infrahilar density,
suspicious for pneumonia.  Left lung is clear.  Cardiopericardial
silhouette is within normal limits for size.  Lateral view is
motion degraded. Imaged bony structures of the thorax are intact.
IMPRESSION: Question right infrahilar pneumonia.  Follow-up recommended to
ensure resolution.

## 2014-02-24 ENCOUNTER — Other Ambulatory Visit: Payer: Self-pay

## 2014-02-24 ENCOUNTER — Telehealth: Payer: Self-pay | Admitting: Family Medicine

## 2014-02-24 MED ORDER — PRAVASTATIN SODIUM 40 MG PO TABS: ORAL_TABLET | ORAL | Status: DC

## 2014-02-24 MED ORDER — HYDROCHLOROTHIAZIDE 25 MG PO TABS: 25.0000 mg | ORAL_TABLET | Freq: Every day | ORAL | Status: DC

## 2014-02-24 NOTE — Telephone Encounter (Signed)
Only cholesterol and b/p med refilled.

## 2014-03-16 ENCOUNTER — Ambulatory Visit (INDEPENDENT_AMBULATORY_CARE_PROVIDER_SITE_OTHER): Payer: BC Managed Care – PPO

## 2014-03-16 ENCOUNTER — Ambulatory Visit (INDEPENDENT_AMBULATORY_CARE_PROVIDER_SITE_OTHER): Payer: BC Managed Care – PPO | Admitting: Family Medicine

## 2014-03-16 ENCOUNTER — Encounter: Payer: Self-pay | Admitting: Family Medicine

## 2014-03-16 VITALS — BP 122/82 | HR 75 | Resp 16 | Ht 71.0 in | Wt 226.0 lb

## 2014-03-16 DIAGNOSIS — Z23 Encounter for immunization: Secondary | ICD-10-CM

## 2014-03-16 DIAGNOSIS — E669 Obesity, unspecified: Secondary | ICD-10-CM

## 2014-03-16 DIAGNOSIS — E785 Hyperlipidemia, unspecified: Secondary | ICD-10-CM

## 2014-03-16 DIAGNOSIS — M549 Dorsalgia, unspecified: Secondary | ICD-10-CM

## 2014-03-16 DIAGNOSIS — R7302 Impaired glucose tolerance (oral): Secondary | ICD-10-CM

## 2014-03-16 DIAGNOSIS — R569 Unspecified convulsions: Secondary | ICD-10-CM

## 2014-03-16 DIAGNOSIS — I10 Essential (primary) hypertension: Secondary | ICD-10-CM

## 2014-03-16 LAB — COMPREHENSIVE METABOLIC PANEL
ALT: 25 U/L (ref 0–53)
AST: 19 U/L (ref 0–37)
Albumin: 4.2 g/dL (ref 3.5–5.2)
Alkaline Phosphatase: 80 U/L (ref 39–117)
BILIRUBIN TOTAL: 0.4 mg/dL (ref 0.2–1.2)
BUN: 12 mg/dL (ref 6–23)
CALCIUM: 9.6 mg/dL (ref 8.4–10.5)
CHLORIDE: 100 meq/L (ref 96–112)
CO2: 30 mEq/L (ref 19–32)
CREATININE: 0.85 mg/dL (ref 0.50–1.35)
Glucose, Bld: 91 mg/dL (ref 70–99)
Potassium: 3.8 mEq/L (ref 3.5–5.3)
Sodium: 139 mEq/L (ref 135–145)
Total Protein: 7.4 g/dL (ref 6.0–8.3)

## 2014-03-16 LAB — HEMOGLOBIN A1C
HEMOGLOBIN A1C: 6 % — AB (ref <5.7)
Mean Plasma Glucose: 126 mg/dL — ABNORMAL HIGH (ref <117)

## 2014-03-16 LAB — LIPID PANEL
CHOL/HDL RATIO: 3.9 ratio
Cholesterol: 196 mg/dL (ref 0–200)
HDL: 50 mg/dL (ref >39)
LDL Cholesterol: 122 mg/dL — ABNORMAL HIGH (ref 0–99)
Triglycerides: 121 mg/dL (ref <150)
VLDL: 24 mg/dL (ref 0–40)

## 2014-03-16 LAB — PHENYTOIN LEVEL, TOTAL: Phenytoin Lvl: 14.7 ug/mL (ref 10.0–20.0)

## 2014-03-16 MED ORDER — IBUPROFEN 800 MG PO TABS: ORAL_TABLET | ORAL | Status: DC

## 2014-03-16 MED ORDER — PREDNISONE (PAK) 5 MG PO TABS: 5.0000 mg | ORAL_TABLET | ORAL | Status: DC

## 2014-03-16 MED ORDER — HYDROCHLOROTHIAZIDE 25 MG PO TABS
25.0000 mg | ORAL_TABLET | Freq: Every day | ORAL | Status: DC
Start: 2014-03-16 — End: 2014-09-20

## 2014-03-16 MED ORDER — PHENYTOIN SODIUM EXTENDED 100 MG PO CAPS: ORAL_CAPSULE | ORAL | Status: DC

## 2014-03-16 MED ORDER — PRAVASTATIN SODIUM 40 MG PO TABS: ORAL_TABLET | ORAL | Status: DC

## 2014-03-16 NOTE — Assessment & Plan Note (Addendum)
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

## 2014-03-16 NOTE — Progress Notes (Signed)
   Subjective:    Patient ID: Reginald Gonzales, male    DOB: April 26, 1965, 49 y.o.   MRN: [id number]  HPI  The PT is here for follow up and re-evaluation of chronic medical conditions, medication management and review of any available recent lab and radiology data.  Preventive health is updated, specifically  Cancer screening and Immunization.    The PT denies any adverse reactions to current medications since the last visit.  No seizures since last visit, and none since 1995 Acute LBP with bilateral lower ext numbness approx 2 weeks ago, lasted for 1 week, he could hardly move, now , just has some left lower ext numbness, took no meds, still has some discomfort when he elevates the left leg or getting out of bed      Review of Systems See HPI Denies recent fever or chills. Denies sinus pressure, nasal congestion, ear pain or sore throat. Denies chest congestion, productive cough or wheezing. Denies chest pains, palpitations and leg swelling Denies abdominal pain, nausea, vomiting,diarrhea or constipation.   Denies dysuria, frequency, hesitancy or incontinence. . Denies headaches, seizures, . Denies depression, anxiety or insomnia. Denies skin break down or rash.        Objective:   Physical Exam BP 122/82 mmHg  Pulse 75  Resp 16  Ht 5\' 11"  (1.803 m)  Wt 226 lb (102.513 kg)  BMI 31.53 kg/m2  SpO2 99%  Patient alert and oriented and in no cardiopulmonary distress.  HEENT: No facial asymmetry, EOMI,   oropharynx pink and moist.  Neck supple no JVD, no mass.  Chest: Clear to auscultation bilaterally.  CVS: S1, S2 no murmurs, no S3.Regular rate.  ABD: Soft non tender.   Ext: No edema  MS: Adequate ROM spine, shoulders, hips and knees.  Skin: Intact, no ulcerations or rash noted.  Psych: Good eye contact, normal affect. Memory intact not anxious or depressed appearing.  CNS: CN 2-12 intact, power,  normal throughout.no focal deficits noted.          Assessment & Plan:  ESSENTIAL HYPERTENSION, BENIGN Controlled, no change in medication DASH diet and commitment to daily physical activity for a minimum of 30 minutes discussed and encouraged, as a part of hypertension management. The importance of attaining a healthy weight is also discussed.   Hyperlipidemia LDL goal <100 Updated lab needed at/ before next visit. 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 The fact that changes in all these areas will reduce or eliminate all together the development of diabetes is stressed.     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 Improved, but still experiencing left leg tingling at times, short sharp course of anti inflammatories is prescribed Neurologic exam is normal   Obesity (BMI 30.0-34.9) Improved. Pt applauded on succesful weight loss through lifestyle change, and encouraged to continue same. Weight loss goal set for the next several months.

## 2014-03-16 NOTE — Assessment & Plan Note (Signed)
Controlled, no change in medication DASH diet and commitment to daily physical activity for a minimum of 30 minutes discussed and encouraged, as a part of hypertension management. The importance of attaining a healthy weight is also discussed.  

## 2014-03-16 NOTE — Patient Instructions (Signed)
Physical exam in 6 month, call if you need me before  Congrats on weight loss, work on healthy habits  Flu vaccine today  Medoicatin at [facility] for back pain   Six month med  sent in    Labs today  Allthe best for 2016!

## 2014-03-16 NOTE — Assessment & Plan Note (Signed)
Updated lab needed at/ before next visit. Will address med dose following this  Low fat diet discussed

## 2014-03-17 NOTE — Assessment & Plan Note (Signed)
Improved. Pt applauded on succesful weight loss through lifestyle change, and encouraged to continue same. Weight loss goal set for the next several months.  

## 2014-03-17 NOTE — Assessment & Plan Note (Signed)
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 Improved, but still experiencing left leg tingling at times, short sharp course of anti inflammatories is prescribed Neurologic exam is normal

## 2014-03-17 NOTE — Assessment & Plan Note (Signed)
Improved Patient educated about the importance of limiting  Carbohydrate intake , the need to commit to daily physical activity for a minimum of 30 minutes , and to commit weight loss. The fact that changes in all these areas will reduce or eliminate all together the development of diabetes is stressed.

## 2014-03-30 ENCOUNTER — Ambulatory Visit: Payer: BC Managed Care – PPO | Admitting: Family Medicine

## 2014-05-03 ENCOUNTER — Telehealth: Payer: Self-pay

## 2014-05-03 ENCOUNTER — Other Ambulatory Visit: Payer: Self-pay

## 2014-05-03 MED ORDER — PHENYTOIN SODIUM EXTENDED 100 MG PO CAPS: ORAL_CAPSULE | ORAL | Status: DC

## 2014-05-03 NOTE — Telephone Encounter (Signed)
Med sent.

## 2014-05-04 ENCOUNTER — Other Ambulatory Visit: Payer: Self-pay

## 2014-07-21 ENCOUNTER — Telehealth: Payer: Self-pay | Admitting: Family Medicine

## 2014-07-21 MED ORDER — PHENYTOIN SODIUM EXTENDED 100 MG PO CAPS: ORAL_CAPSULE | ORAL | Status: DC

## 2014-07-21 NOTE — Telephone Encounter (Signed)
[facility]

## 2014-09-20 ENCOUNTER — Telehealth: Payer: Self-pay | Admitting: *Deleted

## 2014-09-20 ENCOUNTER — Other Ambulatory Visit: Payer: Self-pay

## 2014-09-20 MED ORDER — PHENYTOIN SODIUM EXTENDED 100 MG PO CAPS: ORAL_CAPSULE | ORAL | Status: DC

## 2014-09-20 MED ORDER — HYDROCHLOROTHIAZIDE 25 MG PO TABS: 25.0000 mg | ORAL_TABLET | Freq: Every day | ORAL | Status: DC

## 2014-09-20 MED ORDER — PRAVASTATIN SODIUM 40 MG PO TABS: ORAL_TABLET | ORAL | Status: DC

## 2014-09-20 NOTE — Telephone Encounter (Signed)
meds refilled 

## 2014-09-20 NOTE — Telephone Encounter (Signed)
Pt called requesting all of his medications to be refilled to [facility] order.

## 2014-09-21 ENCOUNTER — Encounter: Payer: Self-pay | Admitting: *Deleted

## 2014-09-21 ENCOUNTER — Encounter: Payer: BC Managed Care – PPO | Admitting: Family Medicine

## 2015-03-02 ENCOUNTER — Telehealth: Payer: Self-pay | Admitting: Family Medicine

## 2015-03-02 MED ORDER — HYDROCHLOROTHIAZIDE 25 MG PO TABS: 25.0000 mg | ORAL_TABLET | Freq: Every day | ORAL | Status: DC

## 2015-03-02 MED ORDER — PHENYTOIN SODIUM EXTENDED 100 MG PO CAPS: ORAL_CAPSULE | ORAL | Status: DC

## 2015-03-02 MED ORDER — PRAVASTATIN SODIUM 40 MG PO TABS: ORAL_TABLET | ORAL | Status: DC

## 2015-03-02 NOTE — Telephone Encounter (Signed)
Patient is requesting all medications be sent in to mail order, please advise?

## 2015-03-02 NOTE — Telephone Encounter (Signed)
meds sent

## 2015-10-05 ENCOUNTER — Telehealth: Payer: Self-pay | Admitting: Family Medicine

## 2015-10-05 NOTE — Telephone Encounter (Signed)
Last appt was 03/2014. Absolutely no refills until he is seen in office

## 2015-10-05 NOTE — Telephone Encounter (Addendum)
Reginald Gonzales is requesting med refills on hydrochlorothiazide (HYDRODIURIL) 25 MG tablet , phenytoin (DILANTIN) 100 MG ER capsule,pravastatin (PRAVACHOL) 40 MG tablet called to [facility], please advise?

## 2015-10-10 ENCOUNTER — Other Ambulatory Visit: Payer: Self-pay | Admitting: Family Medicine

## 2015-10-10 ENCOUNTER — Other Ambulatory Visit: Payer: Self-pay

## 2015-10-10 MED ORDER — HYDROCHLOROTHIAZIDE 25 MG PO TABS: 25.0000 mg | ORAL_TABLET | Freq: Every day | ORAL | 0 refills | Status: DC

## 2015-10-10 MED ORDER — PHENYTOIN SODIUM EXTENDED 100 MG PO CAPS: ORAL_CAPSULE | ORAL | 0 refills | Status: DC

## 2015-10-10 MED ORDER — PRAVASTATIN SODIUM 40 MG PO TABS: ORAL_TABLET | ORAL | 0 refills | Status: DC

## 2015-10-15 ENCOUNTER — Encounter: Payer: Self-pay | Admitting: Family Medicine

## 2015-10-15 ENCOUNTER — Ambulatory Visit (INDEPENDENT_AMBULATORY_CARE_PROVIDER_SITE_OTHER): Payer: BC Managed Care – PPO | Admitting: Family Medicine

## 2015-10-15 VITALS — BP 128/82 | HR 88 | Resp 18 | Ht 71.0 in | Wt 232.0 lb

## 2015-10-15 DIAGNOSIS — Z125 Encounter for screening for malignant neoplasm of prostate: Secondary | ICD-10-CM

## 2015-10-15 DIAGNOSIS — Z1211 Encounter for screening for malignant neoplasm of colon: Secondary | ICD-10-CM

## 2015-10-15 DIAGNOSIS — R7302 Impaired glucose tolerance (oral): Secondary | ICD-10-CM

## 2015-10-15 DIAGNOSIS — I10 Essential (primary) hypertension: Secondary | ICD-10-CM | POA: Diagnosis not present

## 2015-10-15 DIAGNOSIS — E669 Obesity, unspecified: Secondary | ICD-10-CM

## 2015-10-15 DIAGNOSIS — R569 Unspecified convulsions: Secondary | ICD-10-CM

## 2015-10-15 DIAGNOSIS — E8881 Metabolic syndrome: Secondary | ICD-10-CM

## 2015-10-15 DIAGNOSIS — Z1159 Encounter for screening for other viral diseases: Secondary | ICD-10-CM

## 2015-10-15 DIAGNOSIS — E66811 Obesity, class 1: Secondary | ICD-10-CM

## 2015-10-15 DIAGNOSIS — E785 Hyperlipidemia, unspecified: Secondary | ICD-10-CM

## 2015-10-15 NOTE — Assessment & Plan Note (Signed)
Patient educated about the importance of limiting  Carbohydrate intake , the need to commit to daily physical activity for a minimum of 30 minutes , and to commit weight loss. The fact that changes in all these areas will reduce or eliminate all together the development of diabetes is stressed.  Updated lab needed at/ before next visit.   Diabetic Labs Latest Ref Rng & Units 03/16/2014 03/28/2013 04/28/2012 03/24/2012 09/26/2011  HbA1c <5.7 % 6.0(H) 6.1(H) 5.5 6.1(H) 5.8(H)  Chol 0 - 200 mg/dL 196 227(H) - - -  HDL >39 mg/dL 50 46 - - -  Calc LDL 0 - 99 mg/dL 122(H) 127(H) - - -  Triglycerides <150 mg/dL 121 268(H) - - -  Creatinine 0.50 - 1.35 mg/dL 0.85 0.78 - 0.85 -   BP/Weight 10/15/2015 03/16/2014 03/28/2013 08/05/2012 07/07/2012 03/24/2012 [id number]  Systolic BP 0000000 123XX123 A999333 123XX123 A999333 [id number]  Diastolic BP 82 82 78 84 87 84 92  Wt. (Lbs) 232 226 233.04 221.12 223 221 223.12  BMI 32.36 31.53 32.95 31.27 32.92 31.25 31.55   No flowsheet data found.

## 2015-10-15 NOTE — Assessment & Plan Note (Signed)
Deteriorated. Patient re-educated about  the importance of commitment to a  minimum of 150 minutes of exercise per week.  The importance of healthy food choices with portion control discussed. Encouraged to start a food diary, count calories and to consider  joining a support group. Sample diet sheets offered. Goals set by the patient for the next several months.   Weight /BMI 10/15/2015 03/16/2014 03/28/2013  WEIGHT 232 lb 226 lb 233 lb 0.6 oz  HEIGHT 5\' 11"  5\' 11"  5' 10.5"  BMI 32.36 kg/m2 31.53 kg/m2 32.95 kg/m2

## 2015-10-15 NOTE — Progress Notes (Signed)
Reginald Gonzales, Reginald Gonzales     MRN: [id number]      DOB: 1966/03/08   HPI Reginald Gonzales is here for follow up and re-evaluation of chronic medical conditions, medication management and review of any available recent lab and radiology data.  Preventive health is updated, specifically  Cancer screening and Immunization.   Questions or concerns regarding consultations or procedures which the PT has had in the interim are  addressed. The PT denies any adverse reactions to current medications since the last visit.  There are no new concerns.  There are no specific complaints   ROS Denies recent fever or chills. Denies sinus pressure, nasal congestion, ear pain or sore throat. Denies chest congestion, productive cough or wheezing. Denies chest pains, palpitations and leg swelling Denies abdominal pain, nausea, vomiting,diarrhea or constipation.   Denies dysuria, frequency, hesitancy or incontinence. Denies joint pain, swelling and limitation in mobility. Denies headaches, seizures, numbness, or tingling. Denies depression, anxiety or insomnia. Denies skin break down or rash.   PE  BP 128/82   Pulse 88   Resp 18   Ht 5\' 11"  (1.803 m)   Wt 232 lb (105.2 kg)   SpO2 98%   BMI 32.36 kg/m   Patient alert and oriented and in no cardiopulmonary distress.  HEENT: No facial asymmetry, EOMI,   oropharynx pink and moist.  Neck supple no JVD, no mass.  Chest: Clear to auscultation bilaterally.  CVS: S1, S2 no murmurs, no S3.Regular rate.  ABD: Soft non tender.   Ext: No edema  MS: Adequate ROM spine, shoulders, hips and knees.  Skin: Intact, no ulcerations or rash noted.  Psych: Good eye contact, normal affect. Memory intact not anxious or depressed appearing.  CNS: CN 2-12 intact, power,  normal throughout.no focal deficits noted.   Assessment & Plan  ESSENTIAL HYPERTENSION, BENIGN Controlled, no change in medication DASH diet and commitment to daily physical activity for a  minimum of 30 minutes discussed and encouraged, as a part of hypertension management. The importance of attaining a healthy weight is also discussed.  BP/Weight 10/15/2015 03/16/2014 03/28/2013 08/05/2012 07/07/2012 03/24/2012 Q000111Q  Systolic BP 0000000 123XX123 A999333 123XX123 A999333 [id number]  Diastolic BP 82 82 78 84 87 84 92  Wt. (Lbs) 232 226 233.04 221.12 223 221 223.12  BMI 32.36 31.53 32.95 31.27 32.92 31.25 31.55       Obesity (BMI 30.0-34.9) Deteriorated. Patient re-educated about  the importance of commitment to a  minimum of 150 minutes of exercise per week.  The importance of healthy food choices with portion control discussed. Encouraged to start a food diary, count calories and to consider  joining a support group. Sample diet sheets offered. Goals set by the patient for the next several months.   Weight /BMI 10/15/2015 03/16/2014 03/28/2013  WEIGHT 232 lb 226 lb 233 lb 0.6 oz  HEIGHT 5\' 11"  5\' 11"  5' 10.5"  BMI 32.36 kg/m2 31.53 kg/m2 32.95 kg/m2      Convulsions No seizure activity since last visit  Continue current meds , check drug level  Metabolic syndrome X The increased risk of cardiovascular disease associated with this diagnosis, and the need to consistently work on lifestyle to change this is discussed. Following  a  heart healthy diet ,commitment to 30 minutes of exercise at least 5 days per week, as well as control of blood sugar and cholesterol , and achieving a healthy weight are all the areas to be addressed .   IGT (impaired  glucose tolerance) Patient educated about the importance of limiting  Carbohydrate intake , the need to commit to daily physical activity for a minimum of 30 minutes , and to commit weight loss. The fact that changes in all these areas will reduce or eliminate all together the development of diabetes is stressed.  Updated lab needed at/ before next visit.   Diabetic Labs Latest Ref Rng & Units 03/16/2014 03/28/2013 04/28/2012 03/24/2012 09/26/2011  HbA1c  <5.7 % 6.0(H) 6.1(H) 5.5 6.1(H) 5.8(H)  Chol 0 - 200 mg/dL 196 227(H) - - -  HDL >39 mg/dL 50 46 - - -  Calc LDL 0 - 99 mg/dL 122(H) 127(H) - - -  Triglycerides <150 mg/dL 121 268(H) - - -  Creatinine 0.50 - 1.35 mg/dL 0.85 0.78 - 0.85 -   BP/Weight 10/15/2015 03/16/2014 03/28/2013 08/05/2012 07/07/2012 03/24/2012 Q000111Q  Systolic BP 0000000 123XX123 A999333 123XX123 A999333 [id number]  Diastolic BP 82 82 78 84 87 84 92  Wt. (Lbs) 232 226 233.04 221.12 223 221 223.12  BMI 32.36 31.53 32.95 31.27 32.92 31.25 31.55   No flowsheet data found.    Hyperlipidemia LDL goal <100 Hyperlipidemia:Low fat diet discussed and encouraged.   Lipid Panel  Lab Results  Component Value Date   CHOL 196 03/16/2014   HDL 50 03/16/2014   LDLCALC 122 (H) 03/16/2014   TRIG 121 03/16/2014   CHOLHDL 3.9 03/16/2014   Updated lab needed at/ before next visit.

## 2015-10-15 NOTE — Assessment & Plan Note (Signed)
Hyperlipidemia:Low fat diet discussed and encouraged.   Lipid Panel  Lab Results  Component Value Date   CHOL 196 03/16/2014   HDL 50 03/16/2014   LDLCALC 122 (H) 03/16/2014   TRIG 121 03/16/2014   CHOLHDL 3.9 03/16/2014   Updated lab needed at/ before next visit.

## 2015-10-15 NOTE — Assessment & Plan Note (Signed)
No seizure activity since last visit  Continue current meds , check drug level

## 2015-10-15 NOTE — Assessment & Plan Note (Signed)
The increased risk of cardiovascular disease associated with this diagnosis, and the need to consistently work on lifestyle to change this is discussed. Following  a  heart healthy diet ,commitment to 30 minutes of exercise at least 5 days per week, as well as control of blood sugar and cholesterol , and achieving a healthy weight are all the areas to be addressed .  

## 2015-10-15 NOTE — Patient Instructions (Addendum)
F/u in 4 month, call if you need me before  Fasting  L;abs this week.    You are referred to Dr Sydell Axon for screening colonoscopy due after your bitrthday in October , they will call you to schedule   Please work on good  health habits so that your health will improve. 1. Commitment to daily physical activity for 30 to 60  minutes, if you are able to do this.  2. Commitment to wise food choices. Aim for half of your  food intake to be vegetable and fruit, one quarter starchy foods, and one quarter protein. Try to eat on a regular schedule  3 meals per day, snacking between meals should be limited to vegetables or fruits or small portions of nuts. 64 ounces of water per day is generally recommended, unless you have specific health conditions, like heart failure or kidney failure where you will need to limit fluid intake.  3. Commitment to sufficient and a  good quality of physical and mental rest daily, generally between 6 to 8 hours per day.  WITH PERSISTANCE AND PERSEVERANCE, THE IMPOSSIBLE , BECOMES THE NORM!   Thank you  for choosing [facility]. We consider it a privelige to serve you.  Delivering excellent health care in a caring and  compassionate way is our goal.  Partnering with you,  so that together we can achieve this goal is our strategy.

## 2015-10-15 NOTE — Assessment & Plan Note (Signed)
Controlled, no change in medication DASH diet and commitment to daily physical activity for a minimum of 30 minutes discussed and encouraged, as a part of hypertension management. The importance of attaining a healthy weight is also discussed.  BP/Weight 10/15/2015 03/16/2014 03/28/2013 08/05/2012 07/07/2012 03/24/2012 [id number]  Systolic BP 0000000 123XX123 A999333 123XX123 A999333 [id number]  Diastolic BP 82 82 78 84 87 84 92  Wt. (Lbs) 232 226 233.04 221.12 223 221 223.12  BMI 32.36 31.53 32.95 31.27 32.92 31.25 31.55

## 2015-10-19 ENCOUNTER — Other Ambulatory Visit: Payer: Self-pay

## 2015-10-19 MED ORDER — HYDROCHLOROTHIAZIDE 25 MG PO TABS: 25.0000 mg | ORAL_TABLET | Freq: Every day | ORAL | 0 refills | Status: DC

## 2015-10-19 MED ORDER — PHENYTOIN SODIUM EXTENDED 100 MG PO CAPS: ORAL_CAPSULE | ORAL | 0 refills | Status: DC

## 2015-10-19 MED ORDER — PRAVASTATIN SODIUM 40 MG PO TABS: ORAL_TABLET | ORAL | 0 refills | Status: DC

## 2015-10-20 LAB — CBC
HCT: 44.6 % (ref 38.5–50.0)
Hemoglobin: 15.5 g/dL (ref 13.2–17.1)
MCH: 29.2 pg (ref 27.0–33.0)
MCHC: 34.8 g/dL (ref 32.0–36.0)
MCV: 84 fL (ref 80.0–100.0)
MPV: 10.9 fL (ref 7.5–12.5)
PLATELETS: 138 10*3/uL — AB (ref 140–400)
RBC: 5.31 MIL/uL (ref 4.20–5.80)
RDW: 14 % (ref 11.0–15.0)
WBC: 4.8 10*3/uL (ref 3.8–10.8)

## 2015-10-20 LAB — COMPREHENSIVE METABOLIC PANEL
ALBUMIN: 4.4 g/dL (ref 3.6–5.1)
ALK PHOS: 80 U/L (ref 40–115)
ALT: 24 U/L (ref 9–46)
AST: 17 U/L (ref 10–40)
BILIRUBIN TOTAL: 0.4 mg/dL (ref 0.2–1.2)
BUN: 10 mg/dL (ref 7–25)
CALCIUM: 9.5 mg/dL (ref 8.6–10.3)
CHLORIDE: 103 mmol/L (ref 98–110)
CO2: 27 mmol/L (ref 20–31)
Creat: 0.75 mg/dL (ref 0.60–1.35)
Glucose, Bld: 89 mg/dL (ref 65–99)
Potassium: 3.7 mmol/L (ref 3.5–5.3)
Sodium: 139 mmol/L (ref 135–146)
TOTAL PROTEIN: 7.5 g/dL (ref 6.1–8.1)

## 2015-10-20 LAB — LIPID PANEL
CHOL/HDL RATIO: 2.5 ratio (ref <=5.0)
Cholesterol: 163 mg/dL (ref 125–200)
HDL: 66 mg/dL (ref >=40)
LDL CALC: 76 mg/dL (ref <130)
TRIGLYCERIDES: 107 mg/dL (ref <150)
VLDL: 21 mg/dL (ref <30)

## 2015-10-20 LAB — PSA: PSA: 0.6 ng/mL (ref <=4.0)

## 2015-10-20 LAB — TSH: TSH: 1.28 mIU/L (ref 0.40–4.50)

## 2015-10-20 LAB — HEMOGLOBIN A1C
Hgb A1c MFr Bld: 5.8 % — ABNORMAL HIGH (ref <5.7)
Mean Plasma Glucose: 120 mg/dL

## 2015-10-20 LAB — PHENYTOIN LEVEL, TOTAL: Phenytoin Lvl: 10.1 ug/mL (ref 10.0–20.0)

## 2015-10-21 LAB — HIV ANTIBODY (ROUTINE TESTING W REFLEX): HIV: NONREACTIVE

## 2015-11-02 ENCOUNTER — Other Ambulatory Visit: Payer: Self-pay

## 2015-11-02 MED ORDER — PHENYTOIN SODIUM EXTENDED 100 MG PO CAPS: ORAL_CAPSULE | ORAL | 0 refills | Status: DC

## 2015-11-02 MED ORDER — PRAVASTATIN SODIUM 40 MG PO TABS: ORAL_TABLET | ORAL | 0 refills | Status: DC

## 2015-11-02 MED ORDER — HYDROCHLOROTHIAZIDE 25 MG PO TABS: 25.0000 mg | ORAL_TABLET | Freq: Every day | ORAL | 0 refills | Status: DC

## 2015-11-06 ENCOUNTER — Other Ambulatory Visit: Payer: Self-pay | Admitting: Family Medicine

## 2015-11-27 ENCOUNTER — Other Ambulatory Visit: Payer: Self-pay | Admitting: Family Medicine

## 2015-12-11 ENCOUNTER — Telehealth: Payer: Self-pay

## 2015-12-11 NOTE — Telephone Encounter (Signed)
Patient was referred by Dr Moshe Cipro to set up a colonoscopy and was sent a letter. Patient said to call [phone]

## 2015-12-12 NOTE — Telephone Encounter (Signed)
Triaged pt today.

## 2015-12-14 ENCOUNTER — Other Ambulatory Visit: Payer: Self-pay

## 2015-12-14 DIAGNOSIS — Z1211 Encounter for screening for malignant neoplasm of colon: Secondary | ICD-10-CM

## 2015-12-14 NOTE — Telephone Encounter (Signed)
Gastroenterology Pre-Procedure Review  Request Date:12/11/2015 Requesting Physician: Dr. Moshe Cipro  PATIENT REVIEW QUESTIONS: The patient responded to the following health history questions as  indicated:    Pt will need a sample/ cannot buy a prep  1. Diabetes Melitis: no 2. Joint replacements in the past 12 months: no 3. Major health problems in the past 3 months: no 4. Has an artificial valve or MVP: no 5. Has a defibrillator: no 6. Has been advised in past to take antibiotics in advance of a procedure like teeth cleaning: no 7. Family history of colon cancer: no  8. Alcohol Use: no 9. History of sleep apnea: no     MEDICATIONS & ALLERGIES:    Patient reports the following regarding taking any blood thinners:   Plavix? no Aspirin? no Coumadin? no  Patient confirms/reports the following medications:  Current Outpatient Prescriptions  Medication Sig Dispense Refill  . hydrochlorothiazide (HYDRODIURIL) 25 MG tablet Take 1 tablet (25 mg total) by mouth daily. 90 tablet 0  . phenytoin (DILANTIN) 100 MG ER capsule Four capsules in the morning and four capsules at bedtime. Dose increase effective 03/29/2013 720 capsule 0  . pravastatin (PRAVACHOL) 40 MG tablet 2 tabs at bedtime 180 tablet 0   No current facility-administered medications for this visit.     Patient confirms/reports the following allergies:  No Known Allergies  No orders of the defined types were placed in this encounter.   AUTHORIZATION INFORMATION Primary Insurance:   ID #:   Group #:  Pre-Cert / Auth required:  Pre-Cert / Auth #:   Secondary Insurance:  ID #:  Group #:  Pre-Cert / Auth required:  Pre-Cert / Auth #:   SCHEDULE INFORMATION: Procedure has been scheduled as follows:  Date:  12/21/2015              Time:1:45 pm   Location: [facility] Short Stay  This Gastroenterology Pre-Precedure Review Form is being routed to the following provider(s): R. Garfield Cornea, MD

## 2015-12-17 NOTE — Telephone Encounter (Signed)
Ok to schedule.

## 2015-12-18 ENCOUNTER — Other Ambulatory Visit: Payer: Self-pay

## 2015-12-18 ENCOUNTER — Telehealth: Payer: Self-pay

## 2015-12-18 NOTE — Telephone Encounter (Signed)
error 

## 2015-12-18 NOTE — Telephone Encounter (Signed)
PT is aware I have his instructions and a sample prep. He will be by today when he gets off work and pick it up.

## 2015-12-18 NOTE — Telephone Encounter (Signed)
I called [employer] @ [phone] and spoke to [place] who said that  PA is not required for screening colonoscopy.

## 2015-12-21 ENCOUNTER — Encounter (HOSPITAL_COMMUNITY)
Admission: RE | Disposition: A | Discharge status: Home / Self Care | Payer: Self-pay | Source: Ambulatory Visit | Attending: Internal Medicine

## 2015-12-21 ENCOUNTER — Encounter (HOSPITAL_COMMUNITY): Payer: Self-pay | Admitting: *Deleted

## 2015-12-21 ENCOUNTER — Ambulatory Visit (HOSPITAL_COMMUNITY)
Admission: RE | Admit: 2015-12-21 | Discharge: 2015-12-21 | Disposition: A | Discharge status: Home / Self Care | Payer: BC Managed Care – PPO | Source: Ambulatory Visit | Attending: Internal Medicine | Admitting: Internal Medicine

## 2015-12-21 DIAGNOSIS — Z1212 Encounter for screening for malignant neoplasm of rectum: Secondary | ICD-10-CM | POA: Diagnosis not present

## 2015-12-21 DIAGNOSIS — E669 Obesity, unspecified: Secondary | ICD-10-CM | POA: Diagnosis not present

## 2015-12-21 DIAGNOSIS — I10 Essential (primary) hypertension: Secondary | ICD-10-CM | POA: Insufficient documentation

## 2015-12-21 DIAGNOSIS — E785 Hyperlipidemia, unspecified: Secondary | ICD-10-CM | POA: Insufficient documentation

## 2015-12-21 DIAGNOSIS — Z6833 Body mass index (BMI) 33.0-33.9, adult: Secondary | ICD-10-CM | POA: Insufficient documentation

## 2015-12-21 DIAGNOSIS — Z1211 Encounter for screening for malignant neoplasm of colon: Secondary | ICD-10-CM | POA: Insufficient documentation

## 2015-12-21 DIAGNOSIS — G40909 Epilepsy, unspecified, not intractable, without status epilepticus: Secondary | ICD-10-CM | POA: Diagnosis not present

## 2015-12-21 DIAGNOSIS — Z8249 Family history of ischemic heart disease and other diseases of the circulatory system: Secondary | ICD-10-CM | POA: Diagnosis not present

## 2015-12-21 DIAGNOSIS — Z79899 Other long term (current) drug therapy: Secondary | ICD-10-CM | POA: Insufficient documentation

## 2015-12-21 DIAGNOSIS — D123 Benign neoplasm of transverse colon: Secondary | ICD-10-CM

## 2015-12-21 HISTORY — PX: COLONOSCOPY: SHX5424

## 2015-12-21 SURGERY — COLONOSCOPY
Anesthesia: Moderate Sedation

## 2015-12-21 MED ORDER — MEPERIDINE HCL 100 MG/ML IJ SOLN
INTRAMUSCULAR | Status: DC | PRN
  Administered 2015-12-21: 25 mg via INTRAVENOUS
  Administered 2015-12-21: 50 mg via INTRAVENOUS

## 2015-12-21 MED ORDER — MIDAZOLAM HCL 5 MG/5ML IJ SOLN
INTRAMUSCULAR | Status: DC
Start: 2015-12-21 — End: 2015-12-21
  Filled 2015-12-21: qty 10

## 2015-12-21 MED ORDER — ONDANSETRON HCL 4 MG/2ML IJ SOLN
INTRAMUSCULAR | Status: AC
  Filled 2015-12-21: qty 2

## 2015-12-21 MED ORDER — SODIUM CHLORIDE 0.9 % IV SOLN
INTRAVENOUS | Status: DC
  Administered 2015-12-21: 1000 mL via INTRAVENOUS

## 2015-12-21 MED ORDER — STERILE WATER FOR IRRIGATION IR SOLN
Status: DC | PRN
  Administered 2015-12-21: 2.5 mL

## 2015-12-21 MED ORDER — MEPERIDINE HCL 100 MG/ML IJ SOLN
INTRAMUSCULAR | Status: AC
  Filled 2015-12-21: qty 2

## 2015-12-21 MED ORDER — MIDAZOLAM HCL 5 MG/5ML IJ SOLN
INTRAMUSCULAR | Status: DC | PRN
  Administered 2015-12-21 (×2): 2 mg via INTRAVENOUS

## 2015-12-21 NOTE — Discharge Instructions (Addendum)
Colon Polyps Polyps are lumps of extra tissue growing inside the body. Polyps can grow in the large intestine (colon). Most colon polyps are noncancerous (benign). However, some colon polyps can become cancerous over time. Polyps that are larger than a pea may be harmful. To be safe, caregivers remove and test all polyps. CAUSES  Polyps form when mutations in the genes cause your cells to grow and divide even though no more tissue is needed. RISK FACTORS There are a number of risk factors that can increase your chances of getting colon polyps. They include:  Being older than 50 years.  Family history of colon polyps or colon cancer.  Long-term colon diseases, such as colitis or Crohn disease.  Being overweight.  Smoking.  Being inactive.  Drinking too much alcohol. SYMPTOMS  Most small polyps do not cause symptoms. If symptoms are present, they may include:  Blood in the stool. The stool may look dark red or black.  Constipation or diarrhea that lasts longer than 1 week. DIAGNOSIS People often do not know they have polyps until their caregiver finds them during a regular checkup. Your caregiver can use 4 tests to check for polyps:  Digital rectal exam. The caregiver wears gloves and feels inside the rectum. This test would find polyps only in the rectum.  Barium enema. The caregiver puts a liquid called barium into your rectum before taking X-rays of your colon. Barium makes your colon look white. Polyps are dark, so they are easy to see in the X-ray pictures.  Sigmoidoscopy. A thin, flexible tube (sigmoidoscope) is placed into your rectum. The sigmoidoscope has a light and tiny camera in it. The caregiver uses the sigmoidoscope to look at the last third of your colon.  Colonoscopy. This test is like sigmoidoscopy, but the caregiver looks at the entire colon. This is the most common method for finding and removing polyps. TREATMENT  Any polyps will be removed during a  sigmoidoscopy or colonoscopy. The polyps are then tested for cancer. PREVENTION  To help lower your risk of getting more colon polyps:  Eat plenty of fruits and vegetables. Avoid eating fatty foods.  Do not smoke.  Avoid drinking alcohol.  Exercise every day.  Lose weight if recommended by your caregiver.  Eat plenty of calcium and folate. Foods that are rich in calcium include milk, cheese, and broccoli. Foods that are rich in folate include chickpeas, kidney beans, and spinach. HOME CARE INSTRUCTIONS Keep all follow-up appointments as directed by your caregiver. You may need periodic exams to check for polyps. SEEK MEDICAL CARE IF: You notice bleeding during a bowel movement.   This information is not intended to replace advice given to you by your health care provider. Make sure you discuss any questions you have with your health care provider.   Document Released: 11/21/2003 Document Revised: 03/17/2014 Document Reviewed: 05/06/2011 Elsevier Interactive Patient Education 2016 Else[employer] Inc[employer]  Colonoscopy Discharge Instructions  Read the instructions outlined below and refer to this sheet in the next few weeks. These discharge instructions provide you with general information on caring for yourself after you leave the hospital. Your doctor may also give you specific instructions. While your treatment has been planned according to the most current medical practices available, unavoidable complications occasionally occur. If you have any problems or questions after discharge, call Dr. Gala Romney at [phone]. ACTIVITY  You may resume your regular activity, but move at a slower pace for the next 24 hours.   Take frequent rest periods  for the next 24 hours.   Walking will help get rid of the air and reduce the bloated feeling in your belly (abdomen).   No driving for 24 hours (because of the medicine (anesthesia) used during the test).    Do not sign any important legal documents or  operate any machinery for 24 hours (because of the anesthesia used during the test).  NUTRITION  Drink plenty of fluids.   You may resume your normal diet as instructed by your doctor.   Begin with a light meal and progress to your normal diet. Heavy or fried foods are harder to digest and may make you feel sick to your stomach (nauseated).   Avoid alcoholic beverages for 24 hours or as instructed.  MEDICATIONS  You may resume your normal medications unless your doctor tells you otherwise.  WHAT YOU CAN EXPECT TODAY  Some feelings of bloating in the abdomen.   Passage of more gas than usual.   Spotting of blood in your stool or on the toilet paper.  IF YOU HAD POLYPS REMOVED DURING THE COLONOSCOPY:  No aspirin products for 7 days or as instructed.   No alcohol for 7 days or as instructed.   Eat a soft diet for the next 24 hours.  FINDING OUT THE RESULTS OF YOUR TEST Not all test results are available during your visit. If your test results are not back during the visit, make an appointment with your caregiver to find out the results. Do not assume everything is normal if you have not heard from your caregiver or the medical facility. It is important for you to follow up on all of your test results.  SEEK IMMEDIATE MEDICAL ATTENTION IF:  You have more than a spotting of blood in your stool.   Your belly is swollen (abdominal distention).   You are nauseated or vomiting.   You have a temperature over 101.   You have abdominal pain or discomfort that is severe or gets worse throughout the day.    Colon polyp information provided  Further recommendations to follow pending review of pathology report

## 2015-12-21 NOTE — Op Note (Signed)
[facility] Patient Name: Reginald Gonzales, Reginald Gonzales Procedure Date: 12/21/2015 1:05 PM MRN: [id number] Date of Birth: Jul 15, 1965 Attending MD: Norvel Richards , MD CSN: [id number] Age: 50 Admit Type: Outpatient Procedure:                Colonoscopy with snare polypectomy Indications:              Screening for colorectal malignant neoplasm Providers:                Norvel Richards, MD, Lurline Del, RN, Charlyne Petrin, RN, Purcell Nails. [place], [employer] Referring MD:              Medicines:                Midazolam 4 mg IV, Meperidine 75 mg IV, Ondansetron                            4 mg IV Complications:            No immediate complications. Estimated Blood Loss:     Estimated blood loss: none. Procedure:                Pre-Anesthesia Assessment:                           - Prior to the procedure, a History and Physical                            was performed, and patient medications and                            allergies were reviewed. The patient's tolerance of                            previous anesthesia was also reviewed. The risks                            and benefits of the procedure and the sedation                            options and risks were discussed with the patient.                            All questions were answered, and informed consent                            was obtained. Prior Anticoagulants: The patient has                            taken no previous anticoagulant or antiplatelet                            agents. ASA Grade Assessment: II - A patient with  mild systemic disease. After reviewing the risks                            and benefits, the patient was deemed in                            satisfactory condition to undergo the procedure.                           After obtaining informed consent, the colonoscope                            was passed under direct vision. Throughout the                             procedure, the patient's blood pressure, pulse, and                            oxygen saturations were monitored continuously. The                            [id number]) scope was introduced through                            the anus and advanced to the the cecum, identified                            by appendiceal orifice and ileocecal valve. The                            colonoscopy was performed without difficulty. The                            patient tolerated the procedure well. The quality                            of the bowel preparation was adequate. The                            ileocecal valve, appendiceal orifice, and rectum                            were photographed. Scope In: 1:08:42 PM Scope Out: 1:21:11 PM Scope Withdrawal Time: 0 hours 6 minutes 17 seconds  Total Procedure Duration: 0 hours 12 minutes 29 seconds  Findings:      The perianal and digital rectal examinations were normal.      A 6 mm polyp was found in the hepatic flexure. The polyp was       pedunculated. The polyp was removed with a hot snare. Resection and       retrieval were complete. Estimated blood loss: none.      The exam was otherwise without abnormality on direct and retroflexion       views. Impression:               -  One 7 mm polyp at the hepatic flexure, removed                            with a hot snare. Resected and retrieved.                           - The examination was otherwise normal on direct                            and retroflexion views. Moderate Sedation:      Moderate (conscious) sedation was administered by the endoscopy nurse       and supervised by the endoscopist. The following parameters were       monitored: oxygen saturation, heart rate, blood pressure, respiratory       rate, EKG, adequacy of pulmonary ventilation, and response to care.       Total physician intraservice time was 20 minutes. Recommendation:           -  Patient has a contact number available for                            emergencies. The signs and symptoms of potential                            delayed complications were discussed with the                            patient. Return to normal activities tomorrow.                            Written discharge instructions were provided to the                            patient.                           - Resume previous diet.                           - Continue present medications.                           - Repeat colonoscopy date to be determined after                            pending pathology results are reviewed for                            surveillance based on pathology results.                           - Return to GI office (date not yet determined). Procedure Code(s):        --- Professional ---                           [phone], Colonoscopy, flexible; with removal  of                            tumor(s), polyp(s), or other lesion(s) by snare                            technique                           [id number], Moderate sedation services provided by the                            same physician or other qualified health care                            professional performing the diagnostic or                            therapeutic service that the sedation supports,                            requiring the presence of an independent trained                            observer to assist in the monitoring of the                            patient's level of consciousness and physiological                            status; initial 15 minutes of intraservice time,                            patient age 43 years or older Diagnosis Code(s):        --- Professional ---                           Z12.11, Encounter for screening for malignant                            neoplasm of colon                           D12.3, Benign neoplasm of transverse colon (hepatic                             flexure or splenic flexure) CPT copyright 2016 American Medical Association. All rights reserved. The codes documented in this report are preliminary and upon coder review may  be revised to meet current compliance requirements. Cristopher Estimable. Yaretzy Olazabal, MD Norvel Richards, MD 12/21/2015 1:27:06 PM This report has been signed electronically. Number of Addenda: 0

## 2015-12-21 NOTE — H&P (Signed)
@  [id number]   Primary Care Physician:  Tula Nakayama, MD Primary Gastroenterologist:  Dr. Gala Romney  Pre-Procedure History & Physical: HPI:  Reginald Gonzales, Reginald Gonzales is a 50 y.o. male is here for a screening colonoscopy. No bowel symptoms. No family history of colon cancer. No prior colonoscopy.  Past Medical History:  Diagnosis Date  . Acute epididymitis   . Hyperlipidemia   . Hypertension   . Obesity    mild  . Seizure disorder [facility])     History reviewed. No pertinent surgical history.  Prior to Admission medications   Medication Sig Start Date End Date Taking? Authorizing Provider  hydrochlorothiazide (HYDRODIURIL) 25 MG tablet Take 1 tablet (25 mg total) by mouth daily. 11/02/15  Yes Fayrene Helper, MD  phenytoin (DILANTIN) 100 MG ER capsule Four capsules in the morning and four capsules at bedtime. Dose increase effective 03/29/2013 11/02/15  Yes Fayrene Helper, MD  pravastatin (PRAVACHOL) 40 MG tablet 2 tabs at bedtime 11/02/15  Yes Fayrene Helper, MD    Allergies as of 12/14/2015  . (No Known Allergies)    Family History  Problem Relation Age of Onset  . Hypertension Mother   . Heart disease Mother   . Hypertension Father     Social History   Social History  . Marital status: Married    Spouse name: N/A  . Number of children: N/A  . Years of education: N/A   Occupational History  . Not on file.   Social History Main Topics  . Smoking status: Never Smoker  . Smokeless tobacco: Never Used  . Alcohol use No  . Drug use: No  . Sexual activity: Not on file   Other Topics Concern  . Not on file   Social History Narrative  . No narrative on file    Review of Systems: See HPI, otherwise negative ROS  Physical Exam: BP 127/88   Pulse 84   Temp 98.4 F (36.9 C) (Oral)   Resp 15   Ht 5\' 9"  (1.753 m)   Wt 230 lb (104.3 kg)   SpO2 98%   BMI 33.97 kg/m  General:   Alert,  Well-developed, well-nourished, pleasant and cooperative in NAD Lungs:   Clear throughout to auscultation.   No wheezes, crackles, or rhonchi. No acute distress. Heart:  Regular rate and rhythm; no murmurs, clicks, rubs,  or gallops. Abdomen:  Soft, nontender and nondistended. No masses, hepatosplenomegaly or hernias noted. Normal bowel sounds, without guarding, and without rebound.     Impression/Plan: Navi, Reginald Gonzales is now here to undergo a screening colonoscopy.  First ever average risk screening examination  Risks, benefits, limitations, imponderables and alternatives regarding colonoscopy have been reviewed with the patient. Questions have been answered. All parties agreeable.     Notice:  This dictation was prepared with Dragon dictation along with smaller phrase technology. Any transcriptional errors that result from this process are unintentional and may not be corrected upon review.

## 2015-12-25 ENCOUNTER — Encounter: Payer: Self-pay | Admitting: Internal Medicine

## 2015-12-27 ENCOUNTER — Encounter (HOSPITAL_COMMUNITY): Payer: Self-pay | Admitting: Internal Medicine

## 2016-02-14 ENCOUNTER — Encounter: Payer: Self-pay | Admitting: Family Medicine

## 2016-02-14 ENCOUNTER — Ambulatory Visit (INDEPENDENT_AMBULATORY_CARE_PROVIDER_SITE_OTHER): Payer: BC Managed Care – PPO | Admitting: Family Medicine

## 2016-02-14 VITALS — BP 124/86 | HR 87 | Resp 16 | Ht 69.0 in | Wt 233.0 lb

## 2016-02-14 DIAGNOSIS — E785 Hyperlipidemia, unspecified: Secondary | ICD-10-CM | POA: Diagnosis not present

## 2016-02-14 DIAGNOSIS — R569 Unspecified convulsions: Secondary | ICD-10-CM

## 2016-02-14 DIAGNOSIS — I10 Essential (primary) hypertension: Secondary | ICD-10-CM | POA: Diagnosis not present

## 2016-02-14 DIAGNOSIS — Z23 Encounter for immunization: Secondary | ICD-10-CM

## 2016-02-14 DIAGNOSIS — E669 Obesity, unspecified: Secondary | ICD-10-CM | POA: Diagnosis not present

## 2016-02-14 DIAGNOSIS — R5382 Chronic fatigue, unspecified: Secondary | ICD-10-CM

## 2016-02-14 DIAGNOSIS — G4730 Sleep apnea, unspecified: Secondary | ICD-10-CM

## 2016-02-14 DIAGNOSIS — R7302 Impaired glucose tolerance (oral): Secondary | ICD-10-CM

## 2016-02-14 DIAGNOSIS — E66811 Obesity, class 1: Secondary | ICD-10-CM

## 2016-02-14 DIAGNOSIS — E8881 Metabolic syndrome: Secondary | ICD-10-CM

## 2016-02-14 MED ORDER — PREDNISONE 10 MG PO TABS: 10.0000 mg | ORAL_TABLET | Freq: Two times a day (BID) | ORAL | 0 refills | Status: DC

## 2016-02-14 NOTE — Patient Instructions (Signed)
F/u end April, call if you need me sooner  Flu vaccine today  Change eating to faciliytate weight loss .  You are referred for sleep study in January per your request  Fasting lipid, cmp , hBA1C and dilantin level end April  Please work on good  health habits so that your health will improve. 1. Commitment to daily physical activity for 30 to 60  minutes, if you are able to do this.  2. Commitment to wise food choices. Aim for half of your  food intake to be vegetable and fruit, one quarter starchy foods, and one quarter protein. Try to eat on a regular schedule  3 meals per day, snacking between meals should be limited to vegetables or fruits or small portions of nuts. 64 ounces of water per day is generally recommended, unless you have specific health conditions, like heart failure or kidney failure where you will need to limit fluid intake.  3. Commitment to sufficient and a  good quality of physical and mental rest daily, generally between 6 to 8 hours per day.  WITH PERSISTANCE AND PERSEVERANCE, THE IMPOSSIBLE , BECOMES THE NORM! Thank you  for choosing [facility]. We consider it a privelige to serve you.  Delivering excellent health care in a caring and  compassionate way is our goal.  Partnering with you,  so that together we can achieve this goal is our strategy.

## 2016-02-17 NOTE — Assessment & Plan Note (Signed)
Hyperlipidemia:Low fat diet discussed and encouraged.   Lipid Panel  Lab Results  Component Value Date   CHOL 163 10/19/2015   HDL 66 10/19/2015   LDLCALC 76 10/19/2015   TRIG 107 10/19/2015   CHOLHDL 2.5 10/19/2015   Not at goal Updated lab needed at/ before next visit.

## 2016-02-17 NOTE — Assessment & Plan Note (Signed)
Controlled, no change in medication DASH diet and commitment to daily physical activity for a minimum of 30 minutes discussed and encouraged, as a part of hypertension management. The importance of attaining a healthy weight is also discussed.  BP/Weight 02/14/2016 12/21/2015 10/15/2015 03/16/2014 03/28/2013 08/05/2012 0000000  Systolic BP A999333 [id number] 0000000 123XX123 A999333 [id number] A999333  Diastolic BP 86 93 82 82 78 84 87  Wt. (Lbs) 233 230 232 226 233.04 221.12 223  BMI 34.41 33.97 32.36 31.53 32.95 31.27 32.92

## 2016-02-17 NOTE — Progress Notes (Signed)
Reginald Gonzales, Reginald Gonzales     MRN: [id number]      DOB: 20-May-1965   HPI Reginald Gonzales is here for follow up and re-evaluation of chronic medical conditions, medication management and review of any available recent lab and radiology data.  Preventive health is updated, specifically  Cancer screening and Immunization.  Colonoscopy done recently and is normal ocedures which the PT has had in the interim are  addressed. The PT denies any adverse reactions to current medications since the last visit.  C/o worsening chronic fatigue, excessive daytime sleepiness, easily falls asleep while sitting down for a short period. Though has some depression and anxiety as he deals with the stress of life, not suicidal or homicidal, and feels that he has the support needed to call on when needed to vent. No interest in or indication for treatment currently Does report excess snoring , sometimes awakens himself snoring, wants to wait for financial reasons until next 2 months for eval for OSA, also plans to work on weight loss more consistently, problem is in food choice, he has an active job ROS Denies recent fever or chills. Denies sinus pressure, nasal congestion, ear pain or sore throat. Denies chest congestion, productive cough or wheezing. Denies chest pains, palpitations and leg swelling Denies abdominal pain, nausea, vomiting,diarrhea or constipation.   Denies dysuria, frequency, hesitancy or incontinence. Denies joint pain, swelling and limitation in mobility. Denies headaches, seizures, numbness, or tingling. Denies  Uncontrolled depression, anxiety or insomnia. Denies skin break down or rash.   PE  BP 124/86   Pulse 87   Resp 16   Ht 5\' 9"  (1.753 m)   Wt 233 lb (105.7 kg)   SpO2 98%   BMI 34.41 kg/m   Patient alert and oriented and in no cardiopulmonary distress.  HEENT: No facial asymmetry, EOMI,   oropharynx pink and moist.  Neck supple no JVD, no mass.  Chest: Clear to auscultation  bilaterally.  CVS: S1, S2 no murmurs, no S3.Regular rate.  ABD: Soft non tender.   Ext: No edema  MS: Adequate ROM spine, shoulders, hips and knees.  Skin: Intact, no ulcerations or rash noted.  Psych: Good eye contact, normal affect. Memory intact not anxious or depressed appearing.  CNS: CN 2-12 intact, power,  normal throughout.no focal deficits noted.   Assessment & Plan  ESSENTIAL HYPERTENSION, BENIGN Controlled, no change in medication DASH diet and commitment to daily physical activity for a minimum of 30 minutes discussed and encouraged, as a part of hypertension management. The importance of attaining a healthy weight is also discussed.  BP/Weight 02/14/2016 12/21/2015 10/15/2015 03/16/2014 03/28/2013 08/05/2012 0000000  Systolic BP A999333 [id number] 0000000 123XX123 A999333 [id number] A999333  Diastolic BP 86 93 82 82 78 84 87  Wt. (Lbs) 233 230 232 226 233.04 221.12 223  BMI 34.41 33.97 32.36 31.53 32.95 31.27 32.92       Obesity (BMI 30.0-34.9) Deteriorated. Patient re-educated about  the importance of commitment to a  minimum of 150 minutes of exercise per week.  The importance of healthy food choices with portion control discussed. Encouraged to start a food diary, count calories and to consider  joining a support group. Sample diet sheets offered. Goals set by the patient for the next several months.   Weight /BMI 02/14/2016 12/21/2015 10/15/2015  WEIGHT 233 lb 230 lb 232 lb  HEIGHT 5\' 9"  5\' 9"  5\' 11"   BMI 34.41 kg/m2 33.97 kg/m2 32.36 kg/m2      Hyperlipidemia LDL  goal <100 Hyperlipidemia:Low fat diet discussed and encouraged.   Lipid Panel  Lab Results  Component Value Date   CHOL 163 10/19/2015   HDL 66 10/19/2015   LDLCALC 76 10/19/2015   TRIG 107 10/19/2015   CHOLHDL 2.5 10/19/2015   Not at goal Updated lab needed at/ before next visit.    Convulsions Controlled, no change in medication Updated lab needed at/ before next visit.   IGT (impaired glucose  tolerance) Patient educated about the importance of limiting  Carbohydrate intake , the need to commit to daily physical activity for a minimum of 30 minutes , and to commit weight loss. The fact that changes in all these areas will reduce or eliminate all together the development of diabetes is stressed.  Updated lab needed at/ before next visit.   Diabetic Labs Latest Ref Rng & Units 10/19/2015 03/16/2014 03/28/2013 04/28/2012 03/24/2012  HbA1c <5.7 % 5.8(H) 6.0(H) 6.1(H) 5.5 6.1(H)  Chol 125 - 200 mg/dL 163 196 227(H) - -  HDL >=40 mg/dL 66 50 46 - -  Calc LDL <130 mg/dL 76 122(H) 127(H) - -  Triglycerides <150 mg/dL 107 121 268(H) - -  Creatinine 0.60 - 1.35 mg/dL 0.75 0.85 0.78 - 0.85   BP/Weight 02/14/2016 12/21/2015 10/15/2015 03/16/2014 03/28/2013 08/05/2012 0000000  Systolic BP A999333 [id number] 0000000 123XX123 A999333 123XX123 A999333  Diastolic BP 86 93 82 82 78 84 87  Wt. (Lbs) 233 230 232 226 233.04 221.12 223  BMI 34.41 33.97 32.36 31.53 32.95 31.27 32.92   No flowsheet data found.    Metabolic syndrome X The increased risk of cardiovascular disease associated with this diagnosis, and the need to consistently work on lifestyle to change this is discussed. Following  a  heart healthy diet ,commitment to 30 minutes of exercise at least 5 days per week, as well as control of blood sugar and cholesterol , and achieving a healthy weight are all the areas to be addressed .   FATIGUE Chronic fatigue , worsening with sleep disordered breathing, likely OSA, will refer for eval

## 2016-02-17 NOTE — Assessment & Plan Note (Signed)
Patient educated about the importance of limiting  Carbohydrate intake , the need to commit to daily physical activity for a minimum of 30 minutes , and to commit weight loss. The fact that changes in all these areas will reduce or eliminate all together the development of diabetes is stressed.  Updated lab needed at/ before next visit.   Diabetic Labs Latest Ref Rng & Units 10/19/2015 03/16/2014 03/28/2013 04/28/2012 03/24/2012  HbA1c <5.7 % 5.8(H) 6.0(H) 6.1(H) 5.5 6.1(H)  Chol 125 - 200 mg/dL 163 196 227(H) - -  HDL >=40 mg/dL 66 50 46 - -  Calc LDL <130 mg/dL 76 122(H) 127(H) - -  Triglycerides <150 mg/dL 107 121 268(H) - -  Creatinine 0.60 - 1.35 mg/dL 0.75 0.85 0.78 - 0.85   BP/Weight 02/14/2016 12/21/2015 10/15/2015 03/16/2014 03/28/2013 08/05/2012 0000000  Systolic BP A999333 [id number]-[id number] 0000000 123XX123 A999333 [id number] A999333  Diastolic BP 86 93 82 82 78 84 87  Wt. (Lbs) 233 230 232 226 233.04 221.12 223  BMI 34.41 33.97 32.36 31.53 32.95 31.27 32.92   No flowsheet data found.

## 2016-02-17 NOTE — Assessment & Plan Note (Signed)
Chronic fatigue , worsening with sleep disordered breathing, likely OSA, will refer for eval

## 2016-02-17 NOTE — Assessment & Plan Note (Signed)
The increased risk of cardiovascular disease associated with this diagnosis, and the need to consistently work on lifestyle to change this is discussed. Following  a  heart healthy diet ,commitment to 30 minutes of exercise at least 5 days per week, as well as control of blood sugar and cholesterol , and achieving a healthy weight are all the areas to be addressed .  

## 2016-02-17 NOTE — Assessment & Plan Note (Signed)
Controlled, no change in medication Updated lab needed at/ before next visit.  

## 2016-02-17 NOTE — Assessment & Plan Note (Signed)
Deteriorated. Patient re-educated about  the importance of commitment to a  minimum of 150 minutes of exercise per week.  The importance of healthy food choices with portion control discussed. Encouraged to start a food diary, count calories and to consider  joining a support group. Sample diet sheets offered. Goals set by the patient for the next several months.   Weight /BMI 02/14/2016 12/21/2015 10/15/2015  WEIGHT 233 lb 230 lb 232 lb  HEIGHT 5\' 9"  5\' 9"  5\' 11"   BMI 34.41 kg/m2 33.97 kg/m2 32.36 kg/m2

## 2016-03-21 ENCOUNTER — Other Ambulatory Visit: Payer: Self-pay

## 2016-03-21 ENCOUNTER — Other Ambulatory Visit: Payer: Self-pay | Admitting: Family Medicine

## 2016-03-21 MED ORDER — PRAVASTATIN SODIUM 80 MG PO TABS: 80.0000 mg | ORAL_TABLET | Freq: Every day | ORAL | 3 refills | Status: DC

## 2016-06-06 ENCOUNTER — Other Ambulatory Visit: Payer: Self-pay | Admitting: Family Medicine

## 2016-07-02 ENCOUNTER — Ambulatory Visit: Payer: BC Managed Care – PPO | Admitting: Family Medicine

## 2016-07-03 ENCOUNTER — Telehealth: Payer: Self-pay | Admitting: Family Medicine

## 2016-07-03 ENCOUNTER — Encounter: Payer: Self-pay | Admitting: Family Medicine

## 2016-07-03 NOTE — Telephone Encounter (Signed)
His meds should have refill until then at [facility]. Is there any in particular that he is not able to get a refill on?

## 2016-07-03 NOTE — Telephone Encounter (Signed)
Patient was a NO SHOW yesterday, the next available that shows is June 4, he states he needs his medications before then.  cb#: [phone]

## 2016-08-11 ENCOUNTER — Encounter: Payer: Self-pay | Admitting: Family Medicine

## 2016-08-11 ENCOUNTER — Ambulatory Visit (INDEPENDENT_AMBULATORY_CARE_PROVIDER_SITE_OTHER): Payer: BC Managed Care – PPO | Admitting: Family Medicine

## 2016-08-11 ENCOUNTER — Other Ambulatory Visit: Payer: Self-pay

## 2016-08-11 VITALS — BP 120/82 | HR 91 | Resp 16 | Ht 69.0 in | Wt 228.0 lb

## 2016-08-11 DIAGNOSIS — E8881 Metabolic syndrome: Secondary | ICD-10-CM

## 2016-08-11 DIAGNOSIS — E785 Hyperlipidemia, unspecified: Secondary | ICD-10-CM | POA: Diagnosis not present

## 2016-08-11 DIAGNOSIS — N529 Male erectile dysfunction, unspecified: Secondary | ICD-10-CM | POA: Insufficient documentation

## 2016-08-11 DIAGNOSIS — I10 Essential (primary) hypertension: Secondary | ICD-10-CM | POA: Diagnosis not present

## 2016-08-11 DIAGNOSIS — E669 Obesity, unspecified: Secondary | ICD-10-CM | POA: Diagnosis not present

## 2016-08-11 DIAGNOSIS — R7302 Impaired glucose tolerance (oral): Secondary | ICD-10-CM

## 2016-08-11 DIAGNOSIS — N528 Other male erectile dysfunction: Secondary | ICD-10-CM

## 2016-08-11 DIAGNOSIS — R569 Unspecified convulsions: Secondary | ICD-10-CM

## 2016-08-11 LAB — COMPLETE METABOLIC PANEL WITH GFR
ALK PHOS: 77 U/L (ref 40–115)
ALT: 29 U/L (ref 9–46)
AST: 20 U/L (ref 10–35)
Albumin: 4.2 g/dL (ref 3.6–5.1)
BUN: 9 mg/dL (ref 7–25)
CALCIUM: 9.6 mg/dL (ref 8.6–10.3)
CHLORIDE: 102 mmol/L (ref 98–110)
CO2: 31 mmol/L (ref 20–31)
Creat: 0.89 mg/dL (ref 0.70–1.33)
Glucose, Bld: 99 mg/dL (ref 65–99)
POTASSIUM: 4.1 mmol/L (ref 3.5–5.3)
Sodium: 141 mmol/L (ref 135–146)
Total Bilirubin: 0.4 mg/dL (ref 0.2–1.2)
Total Protein: 7.4 g/dL (ref 6.1–8.1)

## 2016-08-11 LAB — LIPID PANEL
CHOL/HDL RATIO: 2.9 ratio (ref <5.0)
Cholesterol: 170 mg/dL (ref <200)
HDL: 59 mg/dL (ref >40)
LDL Cholesterol: 82 mg/dL (ref <100)
TRIGLYCERIDES: 145 mg/dL (ref <150)
VLDL: 29 mg/dL (ref <30)

## 2016-08-11 LAB — PHENYTOIN LEVEL, TOTAL: Phenytoin Lvl: 11.9 mg/L (ref 10.0–20.0)

## 2016-08-11 MED ORDER — SILDENAFIL CITRATE 100 MG PO TABS: 100.0000 mg | ORAL_TABLET | Freq: Every day | ORAL | 1 refills | Status: DC | PRN

## 2016-08-11 NOTE — Assessment & Plan Note (Signed)
Hyperlipidemia:Low fat diet discussed and encouraged.   Lipid Panel  Lab Results  Component Value Date   CHOL 163 10/19/2015   HDL 66 10/19/2015   LDLCALC 76 10/19/2015   TRIG 107 10/19/2015   CHOLHDL 2.5 10/19/2015   Updated lab needed

## 2016-08-11 NOTE — Assessment & Plan Note (Signed)
Controlled, no change in medication DASH diet and commitment to daily physical activity for a minimum of 30 minutes discussed and encouraged, as a part of hypertension management. The importance of attaining a healthy weight is also discussed.  BP/Weight 08/11/2016 02/14/2016 12/21/2015 10/15/2015 03/16/2014 03/28/2013 0/07/1100  Systolic BP 111 735 670 141 030 131 438  Diastolic BP 82 86 93 82 82 78 84  Wt. (Lbs) 228 233 230 232 226 233.04 221.12  BMI 33.67 34.41 33.97 32.36 31.53 32.95 31.27

## 2016-08-11 NOTE — Patient Instructions (Addendum)
Annual physical exam October 14 or after  Fasting lipid, cmp and EGFr, hBA1C, , phenytoin level as soon as possible  New for erection is viagra 100 mg break in half although it says one tablet for use   Only drink water and increase vegetable and fruit intake   It is important that you exercise regularly at least 30 minutes 5 times a week. If you develop chest pain, have severe difficulty breathing, or feel very tired, stop exercising immediately and seek medical attention   Please work on good  health habits so that your health will improve. 1. Commitment to daily physical activity for 30 to 60  minutes, if you are able to do this.  2. Commitment to wise food choices. Aim for half of your  food intake to be vegetable and fruit, one quarter starchy foods, and one quarter protein. Try to eat on a regular schedule  3 meals per day, snacking between meals should be limited to vegetables or fruits or small portions of nuts. 64 ounces of water per day is generally recommended, unless you have specific health conditions, like heart failure or kidney failure where you will need to limit fluid intake.  3. Commitment to sufficient and a  good quality of physical and mental rest daily, generally between 6 to 8 hours per day.  WITH PERSISTANCE AND PERSEVERANCE, THE IMPOSSIBLE , BECOMES THE NORM!

## 2016-08-11 NOTE — Assessment & Plan Note (Signed)
Problems with attaining an maintaining erections worse in past 6 months has benfited from medication in the past and requests same

## 2016-08-11 NOTE — Assessment & Plan Note (Signed)
Improved Patient re-educated about  the importance of commitment to a  minimum of 150 minutes of exercise per week.  The importance of healthy food choices with portion control discussed. Encouraged to start a food diary, count calories and to consider  joining a support group. Sample diet sheets offered. Goals set by the patient for the next several months.   Weight /BMI 08/11/2016 02/14/2016 12/21/2015  WEIGHT 228 lb 233 lb 230 lb  HEIGHT 5\' 9"  5\' 9"  5\' 9"   BMI 33.67 kg/m2 34.41 kg/m2 33.97 kg/m2

## 2016-08-11 NOTE — Assessment & Plan Note (Signed)
Patient educated about the importance of limiting  Carbohydrate intake , the need to commit to daily physical activity for a minimum of 30 minutes , and to commit weight loss. The fact that changes in all these areas will reduce or eliminate all together the development of diabetes is stressed.   Diabetic Labs Latest Ref Rng & Units 10/19/2015 03/16/2014 03/28/2013 04/28/2012 03/24/2012  HbA1c <5.7 % 5.8(H) 6.0(H) 6.1(H) 5.5 6.1(H)  Chol 125 - 200 mg/dL 163 196 227(H) - -  HDL >=40 mg/dL 66 50 46 - -  Calc LDL <130 mg/dL 76 122(H) 127(H) - -  Triglycerides <150 mg/dL 107 121 268(H) - -  Creatinine 0.60 - 1.35 mg/dL 0.75 0.85 0.78 - 0.85   BP/Weight 08/11/2016 02/14/2016 12/21/2015 10/15/2015 03/16/2014 03/28/2013 7/37/1062  Systolic BP 694 854 627 035 009 381 829  Diastolic BP 82 86 93 82 82 78 84  Wt. (Lbs) 228 233 230 232 226 233.04 221.12  BMI 33.67 34.41 33.97 32.36 31.53 32.95 31.27   No flowsheet data found.  Updated lab needed

## 2016-08-12 LAB — HEMOGLOBIN A1C
HEMOGLOBIN A1C: 6 % — AB (ref <5.7)
MEAN PLASMA GLUCOSE: 126 mg/dL

## 2016-08-12 NOTE — Assessment & Plan Note (Signed)
Controlled denies any seizure activity in over 5 years. Will check phenytoin level

## 2016-08-12 NOTE — Assessment & Plan Note (Signed)
The increased risk of cardiovascular disease associated with this diagnosis, and the need to consistently work on lifestyle to change this is discussed. Following  a  heart healthy diet ,commitment to 30 minutes of exercise at least 5 days per week, as well as control of blood sugar and cholesterol , and achieving a healthy weight are all the areas to be addressed .  

## 2016-08-12 NOTE — Progress Notes (Signed)
Reginald Gonzales, Oland Arquette     MRN: [id number]      DOB: [birth year]   HPI Mr. Koral is here for follow up and re-evaluation of chronic medical conditions, medication management and review of any available recent lab and radiology data.  Preventive health is updated, specifically  Cancer screening and Immunization.   Questions or concerns regarding consultations or procedures which the PT has had in the interim are  addressed. The PT denies any adverse reactions to current medications since the last visit.  C/o difficulty attaining and maintaining erections, no relationship issues, viagra has worked in the past and he requests script  ROS Denies recent fever or chills. Denies sinus pressure, nasal congestion, ear pain or sore throat. Denies chest congestion, productive cough or wheezing. Denies chest pains, palpitations and leg swelling Denies abdominal pain, nausea, vomiting,diarrhea or constipation.   Denies dysuria, frequency, hesitancy or incontinence. Denies joint pain, swelling and limitation in mobility. Denies headaches, seizures, numbness, or tingling. Denies depression, anxiety or insomnia. Denies skin break down or rash.   PE  BP 120/82   Pulse 91   Resp 16   Ht 5\' 9"  (1.753 m)   Wt 228 lb (103.4 kg)   SpO2 98%   BMI 33.67 kg/m   Patient alert and oriented and in no cardiopulmonary distress.  HEENT: No facial asymmetry, EOMI,   oropharynx pink and moist.  Neck supple no JVD, no mass.  Chest: Clear to auscultation bilaterally.  CVS: S1, S2 no murmurs, no S3.Regular rate.  ABD: Soft non tender.   Ext: No edema  MS: Adequate ROM spine, shoulders, hips and knees.  Skin: Intact, no ulcerations or rash noted.  Psych: Good eye contact, normal affect. Memory intact not anxious or depressed appearing.  CNS: CN 2-12 intact, power,  normal throughout.no focal deficits noted.   Assessment & Plan  ESSENTIAL HYPERTENSION, BENIGN Controlled, no change in  medication DASH diet and commitment to daily physical activity for a minimum of 30 minutes discussed and encouraged, as a part of hypertension management. The importance of attaining a healthy weight is also discussed.  BP/Weight 08/11/2016 02/14/2016 12/21/2015 10/15/2015 03/16/2014 03/28/2013 1/85/6314  Systolic BP 970 263 785 885 027 741 287  Diastolic BP 82 86 93 82 82 78 84  Wt. (Lbs) 228 233 230 232 226 233.04 221.12  BMI 33.67 34.41 33.97 32.36 31.53 32.95 31.27       IGT (impaired glucose tolerance) Patient educated about the importance of limiting  Carbohydrate intake , the need to commit to daily physical activity for a minimum of 30 minutes , and to commit weight loss. The fact that changes in all these areas will reduce or eliminate all together the development of diabetes is stressed.   Diabetic Labs Latest Ref Rng & Units 10/19/2015 03/16/2014 03/28/2013 04/28/2012 03/24/2012  HbA1c <5.7 % 5.8(H) 6.0(H) 6.1(H) 5.5 6.1(H)  Chol 125 - 200 mg/dL 163 196 227(H) - -  HDL >=40 mg/dL 66 50 46 - -  Calc LDL <130 mg/dL 76 122(H) 127(H) - -  Triglycerides <150 mg/dL 107 121 268(H) - -  Creatinine 0.60 - 1.35 mg/dL 0.75 0.85 0.78 - 0.85   BP/Weight 08/11/2016 02/14/2016 12/21/2015 10/15/2015 03/16/2014 03/28/2013 8/67/6720  Systolic BP 947 096 283 662 947 654 650  Diastolic BP 82 86 93 82 82 78 84  Wt. (Lbs) 228 233 230 232 226 233.04 221.12  BMI 33.67 34.41 33.97 32.36 31.53 32.95 31.27   No flowsheet data found.  Updated lab needed    Hyperlipidemia LDL goal <100 Hyperlipidemia:Low fat diet discussed and encouraged.   Lipid Panel  Lab Results  Component Value Date   CHOL 163 10/19/2015   HDL 66 10/19/2015   LDLCALC 76 10/19/2015   TRIG 107 10/19/2015   CHOLHDL 2.5 10/19/2015   Updated lab needed      Obesity (BMI 30.0-34.9) Improved Patient re-educated about  the importance of commitment to a  minimum of 150 minutes of exercise per week.  The importance of healthy food  choices with portion control discussed. Encouraged to start a food diary, count calories and to consider  joining a support group. Sample diet sheets offered. Goals set by the patient for the next several months.   Weight /BMI 08/11/2016 02/14/2016 12/21/2015  WEIGHT 228 lb 233 lb 230 lb  HEIGHT 5\' 9"  5\' 9"  5\' 9"   BMI 33.67 kg/m2 34.41 kg/m2 33.97 kg/m2      Erectile dysfunction Problems with attaining an maintaining erections worse in past 6 months has benfited from medication in the past and requests same  Convulsions Controlled denies any seizure activity in over 5 years. Will check phenytoin level  Metabolic syndrome X The increased risk of cardiovascular disease associated with this diagnosis, and the need to consistently work on lifestyle to change this is discussed. Following  a  heart healthy diet ,commitment to 30 minutes of exercise at least 5 days per week, as well as control of blood sugar and cholesterol , and achieving a healthy weight are all the areas to be addressed .

## 2016-12-21 ENCOUNTER — Other Ambulatory Visit: Payer: Self-pay | Admitting: Family Medicine

## 2016-12-22 NOTE — Telephone Encounter (Signed)
Seen 6 4 18 

## 2016-12-25 ENCOUNTER — Ambulatory Visit (INDEPENDENT_AMBULATORY_CARE_PROVIDER_SITE_OTHER): Payer: BC Managed Care – PPO | Admitting: Family Medicine

## 2016-12-25 ENCOUNTER — Encounter: Payer: Self-pay | Admitting: Family Medicine

## 2016-12-25 ENCOUNTER — Encounter: Payer: BC Managed Care – PPO | Admitting: Family Medicine

## 2016-12-25 VITALS — BP 120/80 | HR 92 | Temp 98.4°F | Resp 16 | Ht 69.0 in | Wt 225.2 lb

## 2016-12-25 DIAGNOSIS — Z23 Encounter for immunization: Secondary | ICD-10-CM

## 2016-12-25 DIAGNOSIS — R7302 Impaired glucose tolerance (oral): Secondary | ICD-10-CM

## 2016-12-25 DIAGNOSIS — E785 Hyperlipidemia, unspecified: Secondary | ICD-10-CM

## 2016-12-25 DIAGNOSIS — R569 Unspecified convulsions: Secondary | ICD-10-CM

## 2016-12-25 DIAGNOSIS — Z0000 Encounter for general adult medical examination without abnormal findings: Secondary | ICD-10-CM

## 2016-12-25 DIAGNOSIS — Z125 Encounter for screening for malignant neoplasm of prostate: Secondary | ICD-10-CM

## 2016-12-25 DIAGNOSIS — I10 Essential (primary) hypertension: Secondary | ICD-10-CM

## 2016-12-25 LAB — HEMOCCULT GUIAC POC 1CARD (OFFICE): FECAL OCCULT BLD: NEGATIVE

## 2016-12-25 MED ORDER — PHENYTOIN SODIUM EXTENDED 100 MG PO CAPS: ORAL_CAPSULE | ORAL | 5 refills | Status: DC

## 2016-12-25 MED ORDER — PRAVASTATIN SODIUM 80 MG PO TABS: 80.0000 mg | ORAL_TABLET | Freq: Every day | ORAL | 1 refills | Status: DC

## 2016-12-25 MED ORDER — HYDROCHLOROTHIAZIDE 25 MG PO TABS: 25.0000 mg | ORAL_TABLET | Freq: Every day | ORAL | 1 refills | Status: DC

## 2016-12-25 NOTE — Progress Notes (Signed)
   Reginald Gonzales, Reginald Gonzales     MRN: [id number]      DOB: 04/08/1965   HPI: Patient is in for annual physical exam. No other health concerns are expressed or addressed at the visit. Immunization is reviewed , and  updated if needed.    PE; BP 120/80   Pulse 92   Temp 98.4 F (36.9 C) (Other (Comment))   Resp 16   Ht 5\' 9"  (1.753 m)   Wt 225 lb 4 oz (102.2 kg)   SpO2 98%   BMI 33.26 kg/m   Pleasant male, alert and oriented x 3, in no cardio-pulmonary distress. Afebrile. HEENT No facial trauma or asymetry. Sinuses non tender. EOMI, External ears normal, tympanic membranes clear. Oropharynx moist, no exudate. Neck: supple, no adenopathy,JVD or thyromegaly.No bruits.  Chest: Clear to ascultation bilaterally.No crackles or wheezes. Non tender to palpation  Breast: No asymetry,no masses. No nipple discharge or inversion. No axillary or supraclavicular adenopathy  Cardiovascular system; Heart sounds normal,  S1 and  S2 ,no S3.  No murmur, or thrill. Apical beat not displaced Peripheral pulses normal.  Abdomen: Soft, non tender, no organomegaly or masses. No bruits. Bowel sounds normal. No guarding, tenderness or rebound.  Rectal:  Normal sphincter tone. No hemorrhoids or  masses. guaiac negative stool. Prostate smooth and firm    Musculoskeletal exam: Full ROM of spine, hips , shoulders and knees. No deformity ,swelling or crepitus noted. No muscle wasting or atrophy.   Neurologic: Cranial nerves 2 to 12 intact. Power, tone ,sensation and reflexes normal throughout. No disturbance in gait. No tremor.  Skin: Intact, no ulceration, erythema , scaling or rash noted. Pigmentation normal throughout  Psych; Normal mood and affect. Judgement and concentration normal   Assessment & Plan:  Annual physical exam Annual exam as documented. Counseling done  re healthy lifestyle involving commitment to 150 minutes exercise per week, heart healthy diet, and  attaining healthy weight.The importance of adequate sleep also discussed. Regular seat belt use and home safety, is also discussed. Changes in health habits are decided on by the patient with goals and time frames  set for achieving them. Immunization and cancer screening needs are specifically addressed at this visit.

## 2016-12-25 NOTE — Patient Instructions (Addendum)
F/u in 6 months, call if you need me before  Flu vaccine today  CBC, hBA1C, fasting lipid, cmp and EGFR , PSA, phenytoin level to be done December 4 or shortly after  Congrats on weight loss, keep it up  It is important that you exercise regularly at least 30 minutes 5 times a week. If you develop chest pain, have severe difficulty breathing, or feel very tired, stop exercising immediately and seek medical attention

## 2016-12-28 NOTE — Assessment & Plan Note (Signed)

## 2017-02-11 ENCOUNTER — Encounter: Payer: BC Managed Care – PPO | Admitting: Family Medicine

## 2017-06-20 LAB — CBC
HCT: 42.5 % (ref 38.5–50.0)
Hemoglobin: 15 g/dL (ref 13.2–17.1)
MCH: 29.4 pg (ref 27.0–33.0)
MCHC: 35.3 g/dL (ref 32.0–36.0)
MCV: 83.3 fL (ref 80.0–100.0)
MPV: 11.4 fL (ref 7.5–12.5)
PLATELETS: 147 10*3/uL (ref 140–400)
RBC: 5.1 10*6/uL (ref 4.20–5.80)
RDW: 12.6 % (ref 11.0–15.0)
WBC: 5.1 10*3/uL (ref 3.8–10.8)

## 2017-06-20 LAB — LIPID PANEL
Cholesterol: 161 mg/dL (ref <200)
HDL: 55 mg/dL (ref >40)
LDL Cholesterol (Calc): 85 mg/dL (calc)
NON-HDL CHOLESTEROL (CALC): 106 mg/dL (ref <130)
TRIGLYCERIDES: 115 mg/dL (ref <150)
Total CHOL/HDL Ratio: 2.9 (calc) (ref <5.0)

## 2017-06-20 LAB — PSA: PSA: 0.8 ng/mL (ref <=4.0)

## 2017-06-20 LAB — COMPLETE METABOLIC PANEL WITH GFR
AG Ratio: 1.6 (calc) (ref 1.0–2.5)
ALT: 20 U/L (ref 9–46)
AST: 18 U/L (ref 10–35)
Albumin: 4.5 g/dL (ref 3.6–5.1)
Alkaline phosphatase (APISO): 76 U/L (ref 40–115)
BUN: 14 mg/dL (ref 7–25)
CALCIUM: 9.5 mg/dL (ref 8.6–10.3)
CHLORIDE: 103 mmol/L (ref 98–110)
CO2: 31 mmol/L (ref 20–32)
Creat: 0.85 mg/dL (ref 0.70–1.33)
GFR, EST AFRICAN AMERICAN: 117 mL/min/{1.73_m2} (ref >=60)
GFR, EST NON AFRICAN AMERICAN: 101 mL/min/{1.73_m2} (ref >=60)
GLUCOSE: 91 mg/dL (ref 65–99)
Globulin: 2.9 g/dL (calc) (ref 1.9–3.7)
Potassium: 3.9 mmol/L (ref 3.5–5.3)
Sodium: 141 mmol/L (ref 135–146)
TOTAL PROTEIN: 7.4 g/dL (ref 6.1–8.1)
Total Bilirubin: 0.5 mg/dL (ref 0.2–1.2)

## 2017-06-20 LAB — HEMOGLOBIN A1C
HEMOGLOBIN A1C: 6.1 %{Hb} — AB (ref <5.7)
Mean Plasma Glucose: 128 (calc)
eAG (mmol/L): 7.1 (calc)

## 2017-06-20 LAB — PHENYTOIN LEVEL, TOTAL: PHENYTOIN, TOTAL: 10.1 mg/L (ref 10.0–20.0)

## 2017-06-24 ENCOUNTER — Ambulatory Visit: Payer: BC Managed Care – PPO | Admitting: Family Medicine

## 2017-06-29 ENCOUNTER — Ambulatory Visit: Payer: BC Managed Care – PPO | Admitting: Family Medicine

## 2017-06-30 ENCOUNTER — Other Ambulatory Visit: Payer: Self-pay | Admitting: Family Medicine

## 2017-08-10 ENCOUNTER — Ambulatory Visit: Payer: BC Managed Care – PPO | Admitting: Family Medicine

## 2017-08-10 ENCOUNTER — Ambulatory Visit (INDEPENDENT_AMBULATORY_CARE_PROVIDER_SITE_OTHER): Payer: BC Managed Care – PPO | Admitting: Family Medicine

## 2017-08-10 ENCOUNTER — Encounter: Payer: Self-pay | Admitting: Family Medicine

## 2017-08-10 VITALS — BP 122/82 | HR 83 | Resp 16 | Ht 69.0 in | Wt 231.0 lb

## 2017-08-10 DIAGNOSIS — I10 Essential (primary) hypertension: Secondary | ICD-10-CM | POA: Diagnosis not present

## 2017-08-10 DIAGNOSIS — R569 Unspecified convulsions: Secondary | ICD-10-CM | POA: Diagnosis not present

## 2017-08-10 DIAGNOSIS — R7302 Impaired glucose tolerance (oral): Secondary | ICD-10-CM

## 2017-08-10 DIAGNOSIS — E785 Hyperlipidemia, unspecified: Secondary | ICD-10-CM

## 2017-08-10 MED ORDER — LORATADINE 10 MG PO TABS: 10.0000 mg | ORAL_TABLET | Freq: Every day | ORAL | 11 refills | Status: DC

## 2017-08-10 MED ORDER — HYDROCHLOROTHIAZIDE 25 MG PO TABS: 25.0000 mg | ORAL_TABLET | Freq: Every day | ORAL | 1 refills | Status: DC

## 2017-08-10 NOTE — Patient Instructions (Addendum)
Physical exam early November, call if you need me sooner  TSH, hBA1C, cmp and EGFR and dilantin level and vit Dlast week in October      Please change food choice to more vegetable, fruit , and only drink water  Goal is 8 pound weight loss, this well improve your energy   It is important that you exercise regularly at least 30 minutes 5 times a week. If you develop chest pain, have severe difficulty breathing, or feel very tired, stop exercising immediately and seek medical attention      [facility]

## 2017-08-16 ENCOUNTER — Encounter: Payer: Self-pay | Admitting: Family Medicine

## 2017-08-16 NOTE — Assessment & Plan Note (Signed)
Controlled on current med dose, no seizure activity since last visit. Need to check med ;level with  next lab

## 2017-08-16 NOTE — Progress Notes (Signed)
Reginald Gonzales.     MRN: [id number]      DOB: Aug 10, 1965   HPI Reginald Gonzales is here for follow up and re-evaluation of chronic medical conditions, medication management and review of any available recent lab and radiology data.  Preventive health is updated, specifically  Cancer screening and Immunization.    The PT denies any adverse reactions to current medications since the last visit.  There are no new concerns.  There are no specific complaints   ROS Denies recent fever or chills. Denies sinus pressure, nasal congestion, ear pain or sore throat. Denies chest congestion, productive cough or wheezing. Denies chest pains, palpitations and leg swelling Denies abdominal pain, nausea, vomiting,diarrhea or constipation.   Denies dysuria, frequency, hesitancy or incontinence. Denies joint pain, swelling and limitation in mobility. Denies headaches, seizures, numbness, or tingling. Denies depression, anxiety or insomnia. Denies skin break down or rash.   PE  BP 122/82   Pulse 83   Resp 16   Ht 5\' 9"  (1.753 m)   Wt 231 lb (104.8 kg)   SpO2 97%   BMI 34.11 kg/m   Patient alert and oriented and in no cardiopulmonary distress.  HEENT: No facial asymmetry, EOMI,   oropharynx pink and moist.  Neck supple no JVD, no mass.  Chest: Clear to auscultation bilaterally.  CVS: S1, S2 no murmurs, no S3.Regular rate.  ABD: Soft non tender.   Ext: No edema  MS: Adequate ROM spine, shoulders, hips and knees.  Skin: Intact, no ulcerations or rash noted.  Psych: Good eye contact, normal affect. Memory intact not anxious or depressed appearing.  CNS: CN 2-12 intact, power,  normal throughout.no focal deficits noted.   Assessment & Plan  ESSENTIAL HYPERTENSION, BENIGN Controlled, no change in medication DASH diet and commitment to daily physical activity for a minimum of 30 minutes discussed and encouraged, as a part of hypertension management. The importance of attaining a  healthy weight is also discussed.  BP/Weight 08/10/2017 12/25/2016 08/11/2016 02/14/2016 12/21/2015 06/13/2701 5/0/0938  Systolic BP 182 993 716 967 893 810 175  Diastolic BP 82 80 82 86 93 82 82  Wt. (Lbs) 231 225.25 228 233 230 232 226  BMI 34.11 33.26 33.67 34.41 33.97 32.36 31.53       Hyperlipidemia LDL goal <100 Hyperlipidemia:Low fat diet discussed and encouraged.   Lipid Panel  Lab Results  Component Value Date   CHOL 161 06/19/2017   HDL 55 06/19/2017   LDLCALC 85 06/19/2017   TRIG 115 06/19/2017   CHOLHDL 2.9 06/19/2017   Controlled, no change in medication    Morbid obesity (HCC) Deteriorated. Patient re-educated about  the importance of commitment to a  minimum of 150 minutes of exercise per week.  The importance of healthy food choices with portion control discussed. Encouraged to start a food diary, count calories and to consider  joining a support group. Sample diet sheets offered. Goals set by the patient for the next several months.   Weight /BMI 08/10/2017 12/25/2016 08/11/2016  WEIGHT 231 lb 225 lb 4 oz 228 lb  HEIGHT 5\' 9"  5\' 9"  5\' 9"   BMI 34.11 kg/m2 33.26 kg/m2 33.67 kg/m2      IGT (impaired glucose tolerance) Patient educated about the importance of limiting  Carbohydrate intake , the need to commit to daily physical activity for a minimum of 30 minutes , and to commit weight loss. The fact that changes in all these areas will reduce or eliminate all  together the development of diabetes is stressed.   Diabetic Labs Latest Ref Rng & Units 06/19/2017 08/11/2016 10/19/2015 03/16/2014 03/28/2013  HbA1c <5.7 % of total Hgb 6.1(H) 6.0(H) 5.8(H) 6.0(H) 6.1(H)  Chol <200 mg/dL 161 170 163 196 227(H)  HDL >40 mg/dL 55 59 66 50 46  Calc LDL mg/dL (calc) 85 82 76 122(H) 127(H)  Triglycerides <150 mg/dL 115 145 107 121 268(H)  Creatinine 0.70 - 1.33 mg/dL 0.85 0.89 0.75 0.85 0.78   BP/Weight 08/10/2017 12/25/2016 08/11/2016 02/14/2016 12/21/2015 10/15/2015 07/12/5623    Systolic BP 638 937 342 876 811 572 620  Diastolic BP 82 80 82 86 93 82 82  Wt. (Lbs) 231 225.25 228 233 230 232 226  BMI 34.11 33.26 33.67 34.41 33.97 32.36 31.53   No flowsheet data found.    Convulsions Controlled on current med dose, no seizure activity since last visit. Need to check med ;level with  next lab

## 2017-08-16 NOTE — Assessment & Plan Note (Signed)
Controlled, no change in medication DASH diet and commitment to daily physical activity for a minimum of 30 minutes discussed and encouraged, as a part of hypertension management. The importance of attaining a healthy weight is also discussed.  BP/Weight 08/10/2017 12/25/2016 08/11/2016 02/14/2016 12/21/2015 03/19/5091 04/15/7122  Systolic BP 580 998 338 250 539 767 341  Diastolic BP 82 80 82 86 93 82 82  Wt. (Lbs) 231 225.25 228 233 230 232 226  BMI 34.11 33.26 33.67 34.41 33.97 32.36 31.53

## 2017-08-16 NOTE — Assessment & Plan Note (Signed)
Patient educated about the importance of limiting  Carbohydrate intake , the need to commit to daily physical activity for a minimum of 30 minutes , and to commit weight loss. The fact that changes in all these areas will reduce or eliminate all together the development of diabetes is stressed.   Diabetic Labs Latest Ref Rng & Units 06/19/2017 08/11/2016 10/19/2015 03/16/2014 03/28/2013  HbA1c <5.7 % of total Hgb 6.1(H) 6.0(H) 5.8(H) 6.0(H) 6.1(H)  Chol <200 mg/dL 161 170 163 196 227(H)  HDL >40 mg/dL 55 59 66 50 46  Calc LDL mg/dL (calc) 85 82 76 122(H) 127(H)  Triglycerides <150 mg/dL 115 145 107 121 268(H)  Creatinine 0.70 - 1.33 mg/dL 0.85 0.89 0.75 0.85 0.78   BP/Weight 08/10/2017 12/25/2016 08/11/2016 02/14/2016 12/21/2015 05/10/231 06/10/5684  Systolic BP 168 372 902 111 552 080 223  Diastolic BP 82 80 82 86 93 82 82  Wt. (Lbs) 231 225.25 228 233 230 232 226  BMI 34.11 33.26 33.67 34.41 33.97 32.36 31.53   No flowsheet data found.

## 2017-08-16 NOTE — Assessment & Plan Note (Signed)
Deteriorated. Patient re-educated about  the importance of commitment to a  minimum of 150 minutes of exercise per week.  The importance of healthy food choices with portion control discussed. Encouraged to start a food diary, count calories and to consider  joining a support group. Sample diet sheets offered. Goals set by the patient for the next several months.   Weight /BMI 08/10/2017 12/25/2016 08/11/2016  WEIGHT 231 lb 225 lb 4 oz 228 lb  HEIGHT 5\' 9"  5\' 9"  5\' 9"   BMI 34.11 kg/m2 33.26 kg/m2 33.67 kg/m2

## 2017-08-16 NOTE — Assessment & Plan Note (Signed)
Hyperlipidemia:Low fat diet discussed and encouraged.   Lipid Panel  Lab Results  Component Value Date   CHOL 161 06/19/2017   HDL 55 06/19/2017   LDLCALC 85 06/19/2017   TRIG 115 06/19/2017   CHOLHDL 2.9 06/19/2017   Controlled, no change in medication

## 2018-01-06 ENCOUNTER — Telehealth: Payer: Self-pay | Admitting: Family Medicine

## 2018-01-06 ENCOUNTER — Other Ambulatory Visit: Payer: Self-pay

## 2018-01-06 MED ORDER — LORATADINE 10 MG PO TABS: 10.0000 mg | ORAL_TABLET | Freq: Every day | ORAL | 1 refills | Status: DC

## 2018-01-06 MED ORDER — HYDROCHLOROTHIAZIDE 25 MG PO TABS: 25.0000 mg | ORAL_TABLET | Freq: Every day | ORAL | 1 refills | Status: DC

## 2018-01-06 MED ORDER — PRAVASTATIN SODIUM 80 MG PO TABS: 80.0000 mg | ORAL_TABLET | Freq: Every day | ORAL | 1 refills | Status: DC

## 2018-01-06 MED ORDER — PHENYTOIN SODIUM EXTENDED 100 MG PO CAPS: ORAL_CAPSULE | ORAL | 5 refills | Status: DC

## 2018-01-06 NOTE — Telephone Encounter (Signed)
Please refill all Medication [facility] [address]

## 2018-01-06 NOTE — Telephone Encounter (Signed)
Scripts refilled and sent to [facility] per patient request

## 2018-01-11 ENCOUNTER — Encounter: Payer: BC Managed Care – PPO | Admitting: Family Medicine

## 2018-02-02 ENCOUNTER — Ambulatory Visit (INDEPENDENT_AMBULATORY_CARE_PROVIDER_SITE_OTHER): Payer: BC Managed Care – PPO | Admitting: Family Medicine

## 2018-02-02 ENCOUNTER — Encounter: Payer: Self-pay | Admitting: Family Medicine

## 2018-02-02 VITALS — BP 124/82 | HR 85 | Resp 15 | Ht 69.0 in | Wt 228.0 lb

## 2018-02-02 DIAGNOSIS — Z23 Encounter for immunization: Secondary | ICD-10-CM | POA: Diagnosis not present

## 2018-02-02 DIAGNOSIS — Z0000 Encounter for general adult medical examination without abnormal findings: Secondary | ICD-10-CM

## 2018-02-02 NOTE — Patient Instructions (Signed)
F/U in 6 months, call if you need me before  Flu vaccine today  Please get labs next door, we are adding lipids,  No need to collect lab order sheet  Blood pressure and exam are good except aim for leaving sugar products alone, so that you get atp a healthy weight  It is important that you exercise regularly at least 30 minutes 5 times a week. If you develop chest pain, have severe difficulty breathing, or feel very tired, stop exercising immediately and seek medical attention    Please work on good  health habits so that your health will improve. 1. Commitment to daily physical activity for 30 to 60  minutes, if you are able to do this.  2. Commitment to wise food choices. Aim for half of your  food intake to be vegetable and fruit, one quarter starchy foods, and one quarter protein. Try to eat on a regular schedule  3 meals per day, snacking between meals should be limited to vegetables or fruits or small portions of nuts. 64 ounces of water per day is generally recommended, unless you have specific health conditions, like heart failure or kidney failure where you will need to limit fluid intake.  3. Commitment to sufficient and a  good quality of physical and mental rest daily, generally between 6 to 8 hours per day.  WITH PERSISTANCE AND PERSEVERANCE, THE IMPOSSIBLE , BECOMES THE NORM! Thank you  for choosing [facility]. We consider it a privelige to serve you.  Delivering excellent health care in a caring and  compassionate way is our goal.  Partnering with you,  so that together we can achieve this goal is our strategy.

## 2018-02-02 NOTE — Progress Notes (Signed)
   Reginald Gonzales.     MRN: [id number]      DOB: 08/30/1965   HPI: Patient is in for annual physical exam. No other health concerns are expressed or addressed at the visit.  Immunization is reviewed , and  updated    PE; BP 124/82   Pulse 85   Resp 15   Ht 5\' 9"  (1.753 m)   Wt 228 lb (103.4 kg)   BMI 33.67 kg/m   Pleasant male, alert and oriented x 3, in no cardio-pulmonary distress. Afebrile. HEENT No facial trauma or asymetry. Sinuses non tender. EOMI, pupils equally reactive to light. External ears normal, tympanic membranes clear. Oropharynx moist, no exudate. Neck: supple, no adenopathy,JVD or thyromegaly.No bruits.  Chest: Clear to ascultation bilaterally.No crackles or wheezes. Non tender to palpation  Breast: No asymetry,no masses. No nipple discharge or inversion. No axillary or supraclavicular adenopathy  Cardiovascular system; Heart sounds normal,  S1 and  S2 ,no S3.  No murmur, or thrill. Apical beat not displaced Peripheral pulses normal.  Abdomen: Soft, non tender, no organomegaly or masses. No bruits. Bowel sounds normal. No guarding, tenderness or rebound.    Musculoskeletal exam: Full ROM of spine, hips , shoulders and knees. No deformity ,swelling or crepitus noted. No muscle wasting or atrophy.   Neurologic: Cranial nerves 2 to 12 intact. Power, tone ,sensation and reflexes normal throughout. No disturbance in gait. No tremor.  Skin: Intact, no ulceration, erythema , scaling or rash noted. Pigmentation normal throughout  Psych; Normal mood and affect. Judgement and concentration normal   Assessment & Plan:  Annual physical exam Annual exam as documented. Counseling done  re healthy lifestyle involving commitment to 150 minutes exercise per week, heart healthy diet, and attaining healthy weight.The importance of adequate sleep also discussed.  Changes in health habits are decided on by the patient with goals and time  frames  set for achieving them. Immunization and cancer screening needs are specifically addressed at this visit.   Need for immunization against influenza After obtaining informed consent, the vaccine is  administered .

## 2018-02-03 ENCOUNTER — Telehealth: Payer: Self-pay

## 2018-02-03 ENCOUNTER — Encounter: Payer: Self-pay | Admitting: Family Medicine

## 2018-02-03 NOTE — Assessment & Plan Note (Signed)
Annual exam as documented. Counseling done  re healthy lifestyle involving commitment to 150 minutes exercise per week, heart healthy diet, and attaining healthy weight.The importance of adequate sleep also discussed. Changes in health habits are decided on by the patient with goals and time frames  set for achieving them. Immunization and cancer screening needs are specifically addressed at this visit. 

## 2018-02-03 NOTE — Assessment & Plan Note (Signed)
After obtaining informed consent, the vaccine is  administered 

## 2018-02-03 NOTE — Telephone Encounter (Signed)
Lipid panel added per MD request

## 2018-02-04 LAB — COMPLETE METABOLIC PANEL WITH GFR
AG Ratio: 1.5 (calc) (ref 1.0–2.5)
ALT: 24 U/L (ref 9–46)
AST: 22 U/L (ref 10–35)
Albumin: 4.7 g/dL (ref 3.6–5.1)
Alkaline phosphatase (APISO): 83 U/L (ref 40–115)
BUN: 11 mg/dL (ref 7–25)
CALCIUM: 9.8 mg/dL (ref 8.6–10.3)
CO2: 30 mmol/L (ref 20–32)
CREATININE: 0.82 mg/dL (ref 0.70–1.33)
Chloride: 101 mmol/L (ref 98–110)
GFR, Est African American: 118 mL/min/{1.73_m2} (ref >=60)
GFR, Est Non African American: 102 mL/min/{1.73_m2} (ref >=60)
Globulin: 3.1 g/dL (calc) (ref 1.9–3.7)
Glucose, Bld: 87 mg/dL (ref 65–99)
Potassium: 3.7 mmol/L (ref 3.5–5.3)
Sodium: 140 mmol/L (ref 135–146)
Total Bilirubin: 0.4 mg/dL (ref 0.2–1.2)
Total Protein: 7.8 g/dL (ref 6.1–8.1)

## 2018-02-04 LAB — LIPID PANEL
Cholesterol: 175 mg/dL (ref <200)
HDL: 59 mg/dL (ref >40)
LDL CHOLESTEROL (CALC): 92 mg/dL
Non-HDL Cholesterol (Calc): 116 mg/dL (calc) (ref <130)
TRIGLYCERIDES: 139 mg/dL (ref <150)
Total CHOL/HDL Ratio: 3 (calc) (ref <5.0)

## 2018-02-04 LAB — HEMOGLOBIN A1C
Hgb A1c MFr Bld: 6 % of total Hgb — ABNORMAL HIGH (ref <5.7)
MEAN PLASMA GLUCOSE: 126 (calc)
eAG (mmol/L): 7 (calc)

## 2018-02-04 LAB — TSH: TSH: 2.16 mIU/L (ref 0.40–4.50)

## 2018-02-04 LAB — PHENYTOIN LEVEL, TOTAL: PHENYTOIN, TOTAL: 12.4 mg/L (ref 10.0–20.0)

## 2018-02-04 LAB — VITAMIN D 25 HYDROXY (VIT D DEFICIENCY, FRACTURES): Vit D, 25-Hydroxy: 14 ng/mL — ABNORMAL LOW (ref 30–100)

## 2018-07-28 ENCOUNTER — Encounter: Payer: Self-pay | Admitting: Family Medicine

## 2018-07-28 ENCOUNTER — Ambulatory Visit (INDEPENDENT_AMBULATORY_CARE_PROVIDER_SITE_OTHER): Payer: BC Managed Care – PPO | Admitting: Family Medicine

## 2018-07-28 VITALS — BP 124/82 | HR 85 | Ht 69.0 in | Wt 228.0 lb

## 2018-07-28 DIAGNOSIS — R569 Unspecified convulsions: Secondary | ICD-10-CM

## 2018-07-28 DIAGNOSIS — E785 Hyperlipidemia, unspecified: Secondary | ICD-10-CM

## 2018-07-28 DIAGNOSIS — E8881 Metabolic syndrome: Secondary | ICD-10-CM | POA: Diagnosis not present

## 2018-07-28 DIAGNOSIS — I10 Essential (primary) hypertension: Secondary | ICD-10-CM | POA: Diagnosis not present

## 2018-07-28 DIAGNOSIS — Z125 Encounter for screening for malignant neoplasm of prostate: Secondary | ICD-10-CM

## 2018-07-28 DIAGNOSIS — R7302 Impaired glucose tolerance (oral): Secondary | ICD-10-CM

## 2018-07-28 NOTE — Progress Notes (Signed)
Virtual Visit via Telephone Note  I connected with Reginald Gonzales. on 07/28/18 at  2:20 PM EDT by telephone and verified that I am speaking with the correct person using two identifiers.  Location: Patient: on the road Provider:office   I discussed the limitations, risks, security and privacy concerns of performing an evaluation and management service by telephone and the availability of in person appointments. I also discussed with the patient that there may be a patient responsible charge related to this service. The patient expressed understanding and agreed to proceed. This visit type is conducted due to national recommendations for restrictions regarding the COVID -19 Pandemic. Due to the patient's age and / or co morbidities, this format is felt to be most appropriate at this time without adequate follow up. The patient has no access to video technology/ had technical difficulties with video, requiring transitioning to audio format  only ( telephone ). All issues noted this document were discussed and addressed,no physical exam can be performed in this format.    History of Present Illness: F/U chronic problems Denies recent fever or chills. Denies sinus pressure, nasal congestion, ear pain or sore throat. Denies chest congestion, productive cough or wheezing. Denies chest pains, palpitations and leg swelling Denies abdominal pain, nausea, vomiting,diarrhea or constipation.   Denies dysuria, frequency, hesitancy or incontinence. Denies joint pain, swelling and limitation in mobility. Denies headaches, seizures, numbness, or tingling. Denies depression, anxiety or insomnia. Denies skin break down or rash.       Observations/Objective: BP 124/82   Pulse 85   Ht 5\' 9"  (1.753 m)   Wt 228 lb (103.4 kg)   BMI 33.67 kg/m   Good communication with no confusion and intact memory. Alert and oriented x 3 No signs of respiratory distress during sppech    Assessment and  Plan: ESSENTIAL HYPERTENSION, BENIGN Controlled, no change in medication DASH diet and commitment to daily physical activity for a minimum of 30 minutes discussed and encouraged, as a part of hypertension management. The importance of attaining a healthy weight is also discussed.  BP/Weight 07/28/2018 02/02/2018 08/10/2017 12/25/2016 08/11/2016 02/14/2016 17/51/0258  Systolic BP 527 782 423 536 144 315 400  Diastolic BP 82 82 82 80 82 86 93  Wt. (Lbs) 228 228 231 225.25 228 233 230  BMI 33.67 33.67 34.11 33.26 33.67 34.41 33.97       Hyperlipidemia LDL goal <100 Hyperlipidemia:Low fat diet discussed and encouraged.   Lipid Panel  Lab Results  Component Value Date   CHOL 196 07/30/2018   HDL 55 07/30/2018   LDLCALC 114 (H) 07/30/2018   TRIG 158 (H) 07/30/2018   CHOLHDL 3.6 07/30/2018   Not at goal needs dietary and ,med change    IGT (impaired glucose tolerance) Patient educated about the importance of limiting  Carbohydrate intake , the need to commit to daily physical activity for a minimum of 30 minutes , and to commit weight loss. The fact that changes in all these areas will reduce or eliminate all together the development of diabetes is stressed.  Updated lab needed at/ before next visit.   Diabetic Labs Latest Ref Rng & Units 07/30/2018 02/02/2018 06/19/2017 08/11/2016 10/19/2015  HbA1c <5.7 % of total Hgb - 6.0(H) 6.1(H) 6.0(H) 5.8(H)  Chol <200 mg/dL 196 175 161 170 163  HDL > OR = 40 mg/dL 55 59 55 59 66  Calc LDL mg/dL (calc) 114(H) 92 85 82 76  Triglycerides <150 mg/dL 158(H) 139 115 145  107  Creatinine 0.70 - 1.33 mg/dL 0.91 0.82 0.85 0.89 0.75   BP/Weight 07/28/2018 02/02/2018 08/10/2017 12/25/2016 08/11/2016 02/14/2016 64/40/3474  Systolic BP 259 563 875 643 329 518 841  Diastolic BP 82 82 82 80 82 86 93  Wt. (Lbs) 228 228 231 225.25 228 233 230  BMI 33.67 33.67 34.11 33.26 33.67 34.41 33.97   No flowsheet data found.    Morbid obesity ([place])   Patient  re-educated about  the importance of commitment to a  minimum of 150 minutes of exercise per week as able.  The importance of healthy food choices with portion control discussed, as well as eating regularly and within a 12 hour window most days. The need to choose "clean , green" food 50 to 75% of the time is discussed, as well as to make water the primary drink and set a goal of 64 ounces water daily.  Encouraged to start a food diary,  and to consider  joining a support group. Sample diet sheets offered. Goals set by the patient for the next several months.   Weight /BMI 07/28/2018 02/02/2018 08/10/2017  WEIGHT 228 lb 228 lb 231 lb  HEIGHT 5\' 9"  5\' 9"  5\' 9"   BMI 33.67 kg/m2 33.67 kg/m2 34.11 kg/m2      Convulsions No recent  H/o seizure activity, however lab level of medication is not at goal, compliance may be the challenge,  Will re educate re the importance of compliance to reduce seizure risk    Follow Up Instructions:    I discussed the assessment and treatment plan with the patient. The patient was provided an opportunity to ask questions and all were answered. The patient agreed with the plan and demonstrated an understanding of the instructions.   The patient was advised to call back or seek an in-person evaluation if the symptoms worsen or if the condition fails to improve as anticipated.  I provided 21 minutes of non-face-to-face time during this encounter.   Tula Nakayama, MD

## 2018-07-28 NOTE — Patient Instructions (Addendum)
Annual physical exam with MD Nov 27 or shortly after, call if you need me before   Please come in for shingrix #1 thjis week, and also get an appointment   Date to return in 8 to 10 weeks for the 2nd shingrix vaccine, nurse visit  Please get fasting lipid, cmp and EGFR, dilantin level , PSA, CBC and hBA1C this week   It is important that you exercise regularly at least 30 minutes 5 times a week. If you develop chest pain, have severe difficulty breathing, or feel very tired, stop exercising immediately and seek medical attention   Think about what you will eat, plan ahead. Choose " clean, green, fresh or frozen" over canned, processed or packaged foods which are more sugary, salty and fatty. 70 to 75% of food eaten should be vegetables and fruit. Three meals at set times with snacks allowed between meals, but they must be fruit or vegetables. Aim to eat over a 12 hour period , example 7 am to 7 pm, and STOP after  your last meal of the day. Drink water,generally about 64 ounces per day, no other drink is as healthy. Fruit juice is best enjoyed in a healthy way, by EATING the fruit.   Social distancing.Stay 6 ft away from people you do not live with Frequent hand washing with soap and water Keeping your hands off of your face.Wear a face mask These 3 practices will help to keep both you and your community healthy during this time. Please practice them faithfully!  Thanks for choosing [facility], we consider it a privelige to serve you.

## 2018-07-30 ENCOUNTER — Ambulatory Visit (INDEPENDENT_AMBULATORY_CARE_PROVIDER_SITE_OTHER): Payer: BC Managed Care – PPO

## 2018-07-30 DIAGNOSIS — Z23 Encounter for immunization: Secondary | ICD-10-CM

## 2018-07-30 NOTE — Progress Notes (Signed)
Will return in July for shingrix #2

## 2018-07-31 LAB — COMPLETE METABOLIC PANEL WITH GFR
AG Ratio: 1.5 (calc) (ref 1.0–2.5)
ALT: 21 U/L (ref 9–46)
AST: 17 U/L (ref 10–35)
Albumin: 4.7 g/dL (ref 3.6–5.1)
Alkaline phosphatase (APISO): 70 U/L (ref 35–144)
BUN: 14 mg/dL (ref 7–25)
CO2: 30 mmol/L (ref 20–32)
Calcium: 10.3 mg/dL (ref 8.6–10.3)
Chloride: 101 mmol/L (ref 98–110)
Creat: 0.91 mg/dL (ref 0.70–1.33)
GFR, Est African American: 112 mL/min/{1.73_m2} (ref >=60)
GFR, Est Non African American: 97 mL/min/{1.73_m2} (ref >=60)
Globulin: 3.1 g/dL (calc) (ref 1.9–3.7)
Glucose, Bld: 95 mg/dL (ref 65–99)
Potassium: 4 mmol/L (ref 3.5–5.3)
Sodium: 139 mmol/L (ref 135–146)
Total Bilirubin: 0.5 mg/dL (ref 0.2–1.2)
Total Protein: 7.8 g/dL (ref 6.1–8.1)

## 2018-07-31 LAB — LIPID PANEL
Cholesterol: 196 mg/dL (ref <200)
HDL: 55 mg/dL (ref >=40)
LDL Cholesterol (Calc): 114 mg/dL (calc) — ABNORMAL HIGH
Non-HDL Cholesterol (Calc): 141 mg/dL (calc) — ABNORMAL HIGH (ref <130)
Total CHOL/HDL Ratio: 3.6 (calc) (ref <5.0)
Triglycerides: 158 mg/dL — ABNORMAL HIGH (ref <150)

## 2018-07-31 LAB — CBC
HCT: 45.7 % (ref 38.5–50.0)
Hemoglobin: 15.8 g/dL (ref 13.2–17.1)
MCH: 29 pg (ref 27.0–33.0)
MCHC: 34.6 g/dL (ref 32.0–36.0)
MCV: 84 fL (ref 80.0–100.0)
MPV: 11.4 fL (ref 7.5–12.5)
Platelets: 145 10*3/uL (ref 140–400)
RBC: 5.44 10*6/uL (ref 4.20–5.80)
RDW: 13.1 % (ref 11.0–15.0)
WBC: 5.5 10*3/uL (ref 3.8–10.8)

## 2018-07-31 LAB — PSA: PSA: 0.7 ng/mL (ref <=4.0)

## 2018-07-31 LAB — PHENYTOIN LEVEL, TOTAL: Phenytoin, Total: 5.1 mg/L — ABNORMAL LOW (ref 10.0–20.0)

## 2018-08-01 NOTE — Assessment & Plan Note (Signed)
Patient educated about the importance of limiting  Carbohydrate intake , the need to commit to daily physical activity for a minimum of 30 minutes , and to commit weight loss. The fact that changes in all these areas will reduce or eliminate all together the development of diabetes is stressed.  Updated lab needed at/ before next visit.   Diabetic Labs Latest Ref Rng & Units 07/30/2018 02/02/2018 06/19/2017 08/11/2016 10/19/2015  HbA1c <5.7 % of total Hgb - 6.0(H) 6.1(H) 6.0(H) 5.8(H)  Chol <200 mg/dL 196 175 161 170 163  HDL > OR = 40 mg/dL 55 59 55 59 66  Calc LDL mg/dL (calc) 114(H) 92 85 82 76  Triglycerides <150 mg/dL 158(H) 139 115 145 107  Creatinine 0.70 - 1.33 mg/dL 0.91 0.82 0.85 0.89 0.75   BP/Weight 07/28/2018 02/02/2018 08/10/2017 12/25/2016 08/11/2016 02/14/2016 77/93/9030  Systolic BP 092 330 076 226 333 545 625  Diastolic BP 82 82 82 80 82 86 93  Wt. (Lbs) 228 228 231 225.25 228 233 230  BMI 33.67 33.67 34.11 33.26 33.67 34.41 33.97   No flowsheet data found.

## 2018-08-01 NOTE — Assessment & Plan Note (Signed)
   Patient re-educated about  the importance of commitment to a  minimum of 150 minutes of exercise per week as able.  The importance of healthy food choices with portion control discussed, as well as eating regularly and within a 12 hour window most days. The need to choose "clean , green" food 50 to 75% of the time is discussed, as well as to make water the primary drink and set a goal of 64 ounces water daily.  Encouraged to start a food diary,  and to consider  joining a support group. Sample diet sheets offered. Goals set by the patient for the next several months.   Weight /BMI 07/28/2018 02/02/2018 08/10/2017  WEIGHT 228 lb 228 lb 231 lb  HEIGHT 5\' 9"  5\' 9"  5\' 9"   BMI 33.67 kg/m2 33.67 kg/m2 34.11 kg/m2

## 2018-08-01 NOTE — Assessment & Plan Note (Signed)
Controlled, no change in medication DASH diet and commitment to daily physical activity for a minimum of 30 minutes discussed and encouraged, as a part of hypertension management. The importance of attaining a healthy weight is also discussed.  BP/Weight 07/28/2018 02/02/2018 08/10/2017 12/25/2016 08/11/2016 02/14/2016 38/93/7342  Systolic BP 876 811 572 620 355 974 163  Diastolic BP 82 82 82 80 82 86 93  Wt. (Lbs) 228 228 231 225.25 228 233 230  BMI 33.67 33.67 34.11 33.26 33.67 34.41 33.97

## 2018-08-01 NOTE — Assessment & Plan Note (Signed)
No recent  H/o seizure activity, however lab level of medication is not at goal, compliance may be the challenge,  Will re educate re the importance of compliance to reduce seizure risk

## 2018-08-01 NOTE — Assessment & Plan Note (Signed)
Hyperlipidemia:Low fat diet discussed and encouraged.   Lipid Panel  Lab Results  Component Value Date   CHOL 196 07/30/2018   HDL 55 07/30/2018   LDLCALC 114 (H) 07/30/2018   TRIG 158 (H) 07/30/2018   CHOLHDL 3.6 07/30/2018   Not at goal needs dietary and ,med change

## 2018-08-03 ENCOUNTER — Ambulatory Visit: Payer: BC Managed Care – PPO | Admitting: Family Medicine

## 2018-08-04 ENCOUNTER — Telehealth: Payer: Self-pay

## 2018-08-04 ENCOUNTER — Other Ambulatory Visit: Payer: Self-pay | Admitting: Family Medicine

## 2018-08-04 ENCOUNTER — Other Ambulatory Visit: Payer: Self-pay

## 2018-08-04 DIAGNOSIS — R569 Unspecified convulsions: Secondary | ICD-10-CM

## 2018-08-04 MED ORDER — ATORVASTATIN CALCIUM 40 MG PO TABS: 40.0000 mg | ORAL_TABLET | Freq: Every day | ORAL | 3 refills | Status: DC

## 2018-08-04 MED ORDER — PHENYTOIN SODIUM EXTENDED 100 MG PO CAPS: ORAL_CAPSULE | ORAL | 5 refills | Status: DC

## 2018-08-04 NOTE — Telephone Encounter (Signed)
-----   Message from Fayrene Helper, MD sent at 08/01/2018  8:21 PM EDT ----- Your bad cholesterol is too high and the dilantin level too low. Medication adjustments , required once you confirm how you take the dilantin I am also recommending changing to atorvastatin for better results with your cholesterol ( pls send back a message after you speak with him0

## 2018-10-01 ENCOUNTER — Ambulatory Visit: Payer: BC Managed Care – PPO

## 2018-10-04 ENCOUNTER — Ambulatory Visit: Payer: BC Managed Care – PPO

## 2018-10-06 ENCOUNTER — Other Ambulatory Visit: Payer: Self-pay | Admitting: Family Medicine

## 2018-10-06 ENCOUNTER — Ambulatory Visit (INDEPENDENT_AMBULATORY_CARE_PROVIDER_SITE_OTHER): Payer: BC Managed Care – PPO

## 2018-10-06 ENCOUNTER — Other Ambulatory Visit: Payer: Self-pay

## 2018-10-06 DIAGNOSIS — Z23 Encounter for immunization: Secondary | ICD-10-CM | POA: Diagnosis not present

## 2018-10-06 NOTE — Progress Notes (Signed)
Patient came in today for Shingrix 2nd in series. Given in right deltoid. Tolerated well

## 2018-11-19 ENCOUNTER — Other Ambulatory Visit: Payer: Self-pay

## 2018-11-19 DIAGNOSIS — Z20822 Contact with and (suspected) exposure to covid-19: Secondary | ICD-10-CM

## 2018-11-22 LAB — NOVEL CORONAVIRUS, NAA: SARS-CoV-2, NAA: NOT DETECTED

## 2018-12-08 ENCOUNTER — Other Ambulatory Visit: Payer: Self-pay | Admitting: *Deleted

## 2018-12-08 DIAGNOSIS — Z20822 Contact with and (suspected) exposure to covid-19: Secondary | ICD-10-CM

## 2018-12-09 LAB — NOVEL CORONAVIRUS, NAA: SARS-CoV-2, NAA: NOT DETECTED

## 2019-02-07 ENCOUNTER — Encounter: Payer: BC Managed Care – PPO | Admitting: Family Medicine

## 2019-02-07 ENCOUNTER — Ambulatory Visit (INDEPENDENT_AMBULATORY_CARE_PROVIDER_SITE_OTHER): Payer: BC Managed Care – PPO | Admitting: Family Medicine

## 2019-02-07 ENCOUNTER — Other Ambulatory Visit: Payer: Self-pay

## 2019-02-07 ENCOUNTER — Encounter: Payer: Self-pay | Admitting: Family Medicine

## 2019-02-07 VITALS — BP 124/82 | HR 91 | Resp 15 | Ht 69.0 in | Wt 239.0 lb

## 2019-02-07 DIAGNOSIS — Z0000 Encounter for general adult medical examination without abnormal findings: Secondary | ICD-10-CM | POA: Diagnosis not present

## 2019-02-07 DIAGNOSIS — E8881 Metabolic syndrome: Secondary | ICD-10-CM

## 2019-02-07 DIAGNOSIS — R569 Unspecified convulsions: Secondary | ICD-10-CM

## 2019-02-07 DIAGNOSIS — Z23 Encounter for immunization: Secondary | ICD-10-CM | POA: Diagnosis not present

## 2019-02-07 DIAGNOSIS — E559 Vitamin D deficiency, unspecified: Secondary | ICD-10-CM

## 2019-02-07 DIAGNOSIS — E785 Hyperlipidemia, unspecified: Secondary | ICD-10-CM

## 2019-02-07 DIAGNOSIS — I10 Essential (primary) hypertension: Secondary | ICD-10-CM

## 2019-02-07 DIAGNOSIS — R7301 Impaired fasting glucose: Secondary | ICD-10-CM

## 2019-02-07 NOTE — Patient Instructions (Signed)
F./U in office with MD first week I June, call if yopu need me before  Non fasting labs tomorrow, HBA1C, chem7 and EGFr, dilantin level and vit D  Flu vaccine today  Think about what you will eat, plan ahead. Choose " clean, green, fresh or frozen" over canned, processed or packaged foods which are more sugary, salty and fatty. 70 to 75% of food eaten should be vegetables and fruit. Three meals at set times with snacks allowed between meals, but they must be fruit or vegetables. Aim to eat over a 12 hour period , example 7 am to 7 pm, and STOP after  your last meal of the day. Drink water,generally about 64 ounces per day, no other drink is as healthy. Fruit juice is best enjoyed in a healthy way, by EATING the fruit.  It is important that you exercise regularly at least 30 minutes 5 times a week. If you develop chest pain, have severe difficulty breathing, or feel very tired, stop exercising immediately and seek medical attention

## 2019-02-07 NOTE — Progress Notes (Signed)
   Reginald Gonzales Reginald Gonzales.     MRN: [id number]      DOB: Jan 29, 1966   HPI: Patient is in for annual physical exam. No other health concerns are expressed or addressed at the visit.  Immunization is reviewed , and  updated if needed.    PE; BP 124/82   Pulse 91   Resp 15   Ht 5\' 9"  (1.753 m)   Wt 239 lb (108.4 kg)   SpO2 97%   BMI 35.29 kg/m   Pleasant male, alert and oriented x 3, in no cardio-pulmonary distress. Afebrile. HEENT No facial trauma or asymetry. Sinuses non tender. EOMI External ears normal,  Neck: supple, no adenopathy,JVD or thyromegaly.No bruits.  Chest: Clear to ascultation bilaterally.No crackles or wheezes. Non tender to palpation  Cardiovascular system; Heart sounds normal,  S1 and  S2 ,no S3.  No murmur, or thrill. Apical beat not displaced Peripheral pulses normal.  Abdomen: Soft, non tender, no organomegaly or masses. No bruits. Bowel sounds normal. No guarding, tenderness or rebound.    Musculoskeletal exam: Full ROM of spine, hips , shoulders and knees. No deformity ,swelling or crepitus noted. No muscle wasting or atrophy.   Neurologic: Cranial nerves 2 to 12 intact. Power, tone ,sensation and reflexes normal throughout. No disturbance in gait. No tremor.  Skin: Intact, no ulceration, erythema , scaling or rash noted. Pigmentation normal throughout  Psych; Normal mood and affect. Judgement and concentration normal   Assessment & Plan:  Annual physical exam Annual exam as documented. Counseling done  re healthy lifestyle involving commitment to 150 minutes exercise per week, heart healthy diet, and attaining healthy weight.The importance of adequate sleep also discussed.  Changes in health habits are decided on by the patient with goals and time frames  set for achieving them. Immunization and cancer screening needs are specifically addressed at this visit.

## 2019-02-09 ENCOUNTER — Encounter: Payer: Self-pay | Admitting: Family Medicine

## 2019-02-09 LAB — VITAMIN D 25 HYDROXY (VIT D DEFICIENCY, FRACTURES): Vit D, 25-Hydroxy: 18 ng/mL — ABNORMAL LOW (ref 30–100)

## 2019-02-09 LAB — BASIC METABOLIC PANEL WITH GFR
BUN: 11 mg/dL (ref 7–25)
CO2: 30 mmol/L (ref 20–32)
Calcium: 9.7 mg/dL (ref 8.6–10.3)
Chloride: 101 mmol/L (ref 98–110)
Creat: 0.86 mg/dL (ref 0.70–1.33)
GFR, Est African American: 115 mL/min/{1.73_m2} (ref >=60)
GFR, Est Non African American: 99 mL/min/{1.73_m2} (ref >=60)
Glucose, Bld: 99 mg/dL (ref 65–99)
Potassium: 3.7 mmol/L (ref 3.5–5.3)
Sodium: 139 mmol/L (ref 135–146)

## 2019-02-09 LAB — PHENYTOIN LEVEL, TOTAL: Phenytoin, Total: 13.6 mg/L (ref 10.0–20.0)

## 2019-02-09 LAB — HEMOGLOBIN A1C
Hgb A1c MFr Bld: 6 % of total Hgb — ABNORMAL HIGH (ref <5.7)
Mean Plasma Glucose: 126 (calc)
eAG (mmol/L): 7 (calc)

## 2019-02-09 MED ORDER — ERGOCALCIFEROL 1.25 MG (50000 UT) PO CAPS: 50000.0000 [IU] | ORAL_CAPSULE | ORAL | 1 refills | Status: DC

## 2019-02-09 NOTE — Assessment & Plan Note (Signed)
Annual exam as documented. Counseling done  re healthy lifestyle involving commitment to 150 minutes exercise per week, heart healthy diet, and attaining healthy weight.The importance of adequate sleep also discussed. Changes in health habits are decided on by the patient with goals and time frames  set for achieving them. Immunization and cancer screening needs are specifically addressed at this visit. 

## 2019-03-24 ENCOUNTER — Encounter: Payer: BC Managed Care – PPO | Admitting: Family Medicine

## 2019-04-07 ENCOUNTER — Other Ambulatory Visit: Payer: Self-pay | Admitting: Family Medicine

## 2019-05-20 ENCOUNTER — Ambulatory Visit: Payer: BC Managed Care – PPO | Attending: Internal Medicine

## 2019-05-20 DIAGNOSIS — Z23 Encounter for immunization: Secondary | ICD-10-CM

## 2019-05-20 NOTE — Progress Notes (Signed)
   [id number]  Name:  Calieb Mavros.    MRN: [id number] DOB: 1965-10-11  05/20/2019  Mr. Luhrsen was observed post Covid-19 immunization for 15 minutes without incident. He was provided with Vaccine Information Sheet and instruction to access the V-Safe system.   Mr. Cuthbertson was instructed to call 911 with any severe reactions post vaccine: Marland Kitchen Difficulty breathing  . Swelling of face and throat  . A fast heartbeat  . A bad rash all over body  . Dizziness and weakness   Immunizations Administered    Name Date Dose VIS Date Route   Moderna COVID-19 Vaccine 05/20/2019  8:19 AM 0.5 mL 02/08/2019 Intramuscular   Manufacturer: Moderna   Lot: [id number]   [place]:[id number]

## 2019-05-29 ENCOUNTER — Other Ambulatory Visit: Payer: Self-pay | Admitting: Family Medicine

## 2019-06-21 ENCOUNTER — Ambulatory Visit: Payer: BC Managed Care – PPO | Attending: Internal Medicine

## 2019-06-21 DIAGNOSIS — Z23 Encounter for immunization: Secondary | ICD-10-CM

## 2019-06-21 NOTE — Progress Notes (Signed)
   [id number]  Name:  Kristi Getachew.    MRN: [id number] DOB: 1965-12-03  06/21/2019  Mr. Kindred was observed post Covid-19 immunization for 15 minutes without incident. He was provided with Vaccine Information Sheet and instruction to access the V-Safe system.   Mr. Sheesley was instructed to call 911 with any severe reactions post vaccine: Marland Kitchen Difficulty breathing  . Swelling of face and throat  . A fast heartbeat  . A bad rash all over body  . Dizziness and weakness   Immunizations Administered    Name Date Dose VIS Date Route   Moderna COVID-19 Vaccine 06/21/2019  9:52 AM 0.5 mL 02/08/2019 Intramuscular   Manufacturer: Moderna   Lot: [id number]   [id number]

## 2019-07-23 ENCOUNTER — Other Ambulatory Visit: Payer: Self-pay | Admitting: Family Medicine

## 2019-09-05 ENCOUNTER — Other Ambulatory Visit: Payer: Self-pay

## 2019-09-05 ENCOUNTER — Encounter: Payer: Self-pay | Admitting: Family Medicine

## 2019-09-05 ENCOUNTER — Ambulatory Visit (INDEPENDENT_AMBULATORY_CARE_PROVIDER_SITE_OTHER): Payer: BC Managed Care – PPO | Admitting: Family Medicine

## 2019-09-05 VITALS — BP 138/78 | HR 82 | Temp 96.8°F | Resp 16 | Ht 69.0 in | Wt 236.1 lb

## 2019-09-05 DIAGNOSIS — E8881 Metabolic syndrome: Secondary | ICD-10-CM

## 2019-09-05 DIAGNOSIS — E785 Hyperlipidemia, unspecified: Secondary | ICD-10-CM

## 2019-09-05 DIAGNOSIS — E559 Vitamin D deficiency, unspecified: Secondary | ICD-10-CM

## 2019-09-05 DIAGNOSIS — Z1159 Encounter for screening for other viral diseases: Secondary | ICD-10-CM

## 2019-09-05 DIAGNOSIS — I10 Essential (primary) hypertension: Secondary | ICD-10-CM | POA: Diagnosis not present

## 2019-09-05 DIAGNOSIS — R569 Unspecified convulsions: Secondary | ICD-10-CM | POA: Diagnosis not present

## 2019-09-05 DIAGNOSIS — Z125 Encounter for screening for malignant neoplasm of prostate: Secondary | ICD-10-CM

## 2019-09-05 MED ORDER — PHENYTOIN SODIUM EXTENDED 100 MG PO CAPS: ORAL_CAPSULE | ORAL | 5 refills | Status: DC

## 2019-09-05 MED ORDER — ATORVASTATIN CALCIUM 40 MG PO TABS: 40.0000 mg | ORAL_TABLET | Freq: Every day | ORAL | 3 refills | Status: DC

## 2019-09-05 NOTE — Assessment & Plan Note (Signed)
Hyperlipidemia:Low fat diet discussed and encouraged.   Lipid Panel  Lab Results  Component Value Date   CHOL 196 07/30/2018   HDL 55 07/30/2018   LDLCALC 114 (H) 07/30/2018   TRIG 158 (H) 07/30/2018   CHOLHDL 3.6 07/30/2018   Updated lab needed has not been taking statin

## 2019-09-05 NOTE — Progress Notes (Signed)
Reginald Gonzales.     MRN: [id number]      DOB: 09-Jan-1966   HPI Reginald Gonzales is here for follow up and re-evaluation of chronic medical conditions, medication management and review of any available recent lab and radiology data.  Preventive health is updated, specifically  Cancer screening and Immunization.   Unintentionally , has been on no statin for weeks, and is not taking phenytoin as prescribed, though he has been seizure free for over 10 years Has cut back on sugar and portion size and has an active job The PT denies any adverse reactions to current medications since the last visit.  There are no new concerns.  There are no specific complaints   ROS Denies recent fever or chills. Denies sinus pressure, nasal congestion, ear pain or sore throat. Denies chest congestion, productive cough or wheezing. Denies chest pains, palpitations and leg swelling Denies abdominal pain, nausea, vomiting,diarrhea or constipation.   Denies dysuria, frequency, hesitancy or incontinence. Denies joint pain, swelling and limitation in mobility. Denies headaches, seizures, numbness, or tingling. Denies depression, anxiety or insomnia. Denies skin break down or rash.   PE  BP 138/78   Pulse 82   Temp (!) 96.8 F (36 C)   Resp 16   Ht 5\' 9"  (1.753 m)   Wt 236 lb 1.9 oz (107.1 kg)   SpO2 97%   BMI 34.87 kg/m   Patient alert and oriented and in no cardiopulmonary distress.  HEENT: No facial asymmetry, EOMI,     Neck supple .  Chest: Clear to auscultation bilaterally.  CVS: S1, S2 no murmurs, no S3.Regular rate.  ABD: Soft non tender.   Ext: No edema  MS: Adequate ROM spine, shoulders, hips and knees.  Skin: Intact, no ulcerations or rash noted.  Psych: Good eye contact, normal affect. Memory intact not anxious or depressed appearing.  CNS: CN 2-12 intact, power,  normal throughout.no focal deficits noted.   Assessment & Plan  ESSENTIAL HYPERTENSION, BENIGN Sub optimal  control, work on weight loss, no med change DASH diet and commitment to daily physical activity for a minimum of 30 minutes discussed and encouraged, as a part of hypertension management. The importance of attaining a healthy weight is also discussed.  BP/Weight 09/05/2019 02/07/2019 07/28/2018 02/02/2018 08/10/2017 40/98/1191 06/14/8293  Systolic BP 621 308 657 846 962 952 841  Diastolic BP 78 82 82 82 82 80 82  Wt. (Lbs) 236.12 239 228 228 231 225.25 228  BMI 34.87 35.29 33.67 33.67 34.11 33.26 33.67       Morbid obesity (HCC) Unchnaged, needs to work on 6 pound weight loss in the mnext 6 months  Patient re-educated about  the importance of commitment to a  minimum of 150 minutes of exercise per week as able.  The importance of healthy food choices with portion control discussed, as well as eating regularly and within a 12 hour window most days. The need to choose "clean , green" food 50 to 75% of the time is discussed, as well as to make water the primary drink and set a goal of 64 ounces water daily.    Weight /BMI 09/05/2019 02/07/2019 07/28/2018  WEIGHT 236 lb 1.9 oz 239 lb 228 lb  HEIGHT 5\' 9"  5\' 9"  5\' 9"   BMI 34.87 kg/m2 35.29 kg/m2 33.67 kg/m2      Hyperlipidemia LDL goal <100 Hyperlipidemia:Low fat diet discussed and encouraged.   Lipid Panel  Lab Results  Component Value Date  CHOL 196 07/30/2018   HDL 55 07/30/2018   LDLCALC 114 (H) 07/30/2018   TRIG 158 (H) 07/30/2018   CHOLHDL 3.6 07/30/2018   Updated lab needed has not been taking statin    Convulsions Seizure free, update dilantin level new script written also

## 2019-09-05 NOTE — Assessment & Plan Note (Signed)
Seizure free, update dilantin level new script written also

## 2019-09-05 NOTE — Assessment & Plan Note (Signed)
Unchnaged, needs to work on 6 pound weight loss in the mnext 6 months  Patient re-educated about  the importance of commitment to a  minimum of 150 minutes of exercise per week as able.  The importance of healthy food choices with portion control discussed, as well as eating regularly and within a 12 hour window most days. The need to choose "clean , green" food 50 to 75% of the time is discussed, as well as to make water the primary drink and set a goal of 64 ounces water daily.    Weight /BMI 09/05/2019 02/07/2019 07/28/2018  WEIGHT 236 lb 1.9 oz 239 lb 228 lb  HEIGHT 5\' 9"  5\' 9"  5\' 9"   BMI 34.87 kg/m2 35.29 kg/m2 33.67 kg/m2

## 2019-09-05 NOTE — Assessment & Plan Note (Signed)
Sub optimal control, work on weight loss, no med change DASH diet and commitment to daily physical activity for a minimum of 30 minutes discussed and encouraged, as a part of hypertension management. The importance of attaining a healthy weight is also discussed.  BP/Weight 09/05/2019 02/07/2019 07/28/2018 02/02/2018 08/10/2017 62/95/2841 05/10/4399  Systolic BP 027 253 664 403 474 259 563  Diastolic BP 78 82 82 82 82 80 82  Wt. (Lbs) 236.12 239 228 228 231 225.25 228  BMI 34.87 35.29 33.67 33.67 34.11 33.26 33.67

## 2019-09-05 NOTE — Patient Instructions (Addendum)
Annualm physical exam with MD, Dec 1 or after, call if you need me sooner  Please get fasting lipid, cmp and EGFr, Vit D, phenytoin level, hepatitis C screen, cBC, TSH and PSA in the next 1week  It is important that you exercise regularly at least 30 minutes 5 times a week. If you develop chest pain, have severe difficulty breathing, or feel very tired, stop exercising immediately and seek medical attention   Think about what you will eat, plan ahead. Choose " clean, green, fresh or frozen" over canned, processed or packaged foods which are more sugary, salty and fatty. 70 to 75% of food eaten should be vegetables and fruit. Three meals at set times with snacks allowed between meals, but they must be fruit or vegetables. Aim to eat over a 12 hour period , example 7 am to 7 pm, and STOP after  your last meal of the day. Drink water,generally about 64 ounces per day, no other drink is as healthy. Fruit juice is best enjoyed in a healthy way, by EATING the fruit. Thanks for choosing [facility], we consider it a privelige to serve you.

## 2019-09-12 ENCOUNTER — Other Ambulatory Visit: Payer: Self-pay | Admitting: Family Medicine

## 2019-09-12 MED ORDER — PHENYTOIN SODIUM EXTENDED 100 MG PO CAPS: 100.0000 mg | ORAL_CAPSULE | Freq: Two times a day (BID) | ORAL | 5 refills | Status: DC

## 2019-09-13 ENCOUNTER — Other Ambulatory Visit: Payer: Self-pay

## 2019-09-13 DIAGNOSIS — R569 Unspecified convulsions: Secondary | ICD-10-CM

## 2019-09-13 LAB — COMPLETE METABOLIC PANEL WITH GFR
AG Ratio: 1.4 (calc) (ref 1.0–2.5)
ALT: 25 U/L (ref 9–46)
AST: 8 U/L — ABNORMAL LOW (ref 10–35)
Albumin: 4.4 g/dL (ref 3.6–5.1)
Alkaline phosphatase (APISO): 67 U/L (ref 35–144)
BUN: 12 mg/dL (ref 7–25)
CO2: 33 mmol/L — ABNORMAL HIGH (ref 20–32)
Calcium: 9.8 mg/dL (ref 8.6–10.3)
Chloride: 103 mmol/L (ref 98–110)
Creat: 0.73 mg/dL (ref 0.70–1.33)
GFR, Est African American: 123 mL/min/{1.73_m2} (ref >=60)
GFR, Est Non African American: 106 mL/min/{1.73_m2} (ref >=60)
Globulin: 3.2 g/dL (calc) (ref 1.9–3.7)
Glucose, Bld: 94 mg/dL (ref 65–99)
Potassium: 3.7 mmol/L (ref 3.5–5.3)
Sodium: 139 mmol/L (ref 135–146)
Total Bilirubin: 0.4 mg/dL (ref 0.2–1.2)
Total Protein: 7.6 g/dL (ref 6.1–8.1)

## 2019-09-13 LAB — HEPATITIS C ANTIBODY
Hepatitis C Ab: NONREACTIVE
SIGNAL TO CUT-OFF: 0.02 (ref <1.00)

## 2019-09-13 LAB — CBC
HCT: 43.3 % (ref 38.5–50.0)
Hemoglobin: 15.3 g/dL (ref 13.2–17.1)
MCH: 30.1 pg (ref 27.0–33.0)
MCHC: 35.3 g/dL (ref 32.0–36.0)
MCV: 85.2 fL (ref 80.0–100.0)
MPV: 10.9 fL (ref 7.5–12.5)
Platelets: 147 10*3/uL (ref 140–400)
RBC: 5.08 10*6/uL (ref 4.20–5.80)
RDW: 12.3 % (ref 11.0–15.0)
WBC: 5.2 10*3/uL (ref 3.8–10.8)

## 2019-09-13 LAB — LIPID PANEL
Cholesterol: 240 mg/dL — ABNORMAL HIGH (ref <200)
HDL: 58 mg/dL (ref >=40)
LDL Cholesterol (Calc): 157 mg/dL (calc) — ABNORMAL HIGH
Non-HDL Cholesterol (Calc): 182 mg/dL (calc) — ABNORMAL HIGH (ref <130)
Total CHOL/HDL Ratio: 4.1 (calc) (ref <5.0)
Triglycerides: 123 mg/dL (ref <150)

## 2019-09-13 LAB — TSH: TSH: 1.64 mIU/L (ref 0.40–4.50)

## 2019-09-13 LAB — VITAMIN D 25 HYDROXY (VIT D DEFICIENCY, FRACTURES): Vit D, 25-Hydroxy: 109 ng/mL — ABNORMAL HIGH (ref 30–100)

## 2019-09-13 LAB — PHENYTOIN LEVEL, TOTAL: Phenytoin, Total: 30.4 mg/L — ABNORMAL HIGH (ref 10.0–20.0)

## 2019-09-13 LAB — PSA: PSA: 0.6 ng/mL (ref <=4.0)

## 2019-12-07 ENCOUNTER — Other Ambulatory Visit: Payer: Self-pay | Admitting: Family Medicine

## 2020-02-13 ENCOUNTER — Encounter: Payer: BC Managed Care – PPO | Admitting: Family Medicine

## 2020-02-14 ENCOUNTER — Encounter: Payer: Self-pay | Admitting: Internal Medicine

## 2020-02-14 ENCOUNTER — Ambulatory Visit (INDEPENDENT_AMBULATORY_CARE_PROVIDER_SITE_OTHER): Payer: BC Managed Care – PPO | Admitting: Internal Medicine

## 2020-02-14 ENCOUNTER — Other Ambulatory Visit: Payer: Self-pay

## 2020-02-14 VITALS — BP 136/85 | HR 84 | Temp 98.7°F | Resp 18 | Ht 69.0 in | Wt 235.1 lb

## 2020-02-14 DIAGNOSIS — R569 Unspecified convulsions: Secondary | ICD-10-CM

## 2020-02-14 DIAGNOSIS — I10 Essential (primary) hypertension: Secondary | ICD-10-CM | POA: Diagnosis not present

## 2020-02-14 DIAGNOSIS — Z23 Encounter for immunization: Secondary | ICD-10-CM

## 2020-02-14 DIAGNOSIS — E785 Hyperlipidemia, unspecified: Secondary | ICD-10-CM

## 2020-02-14 DIAGNOSIS — Z0000 Encounter for general adult medical examination without abnormal findings: Secondary | ICD-10-CM | POA: Diagnosis not present

## 2020-02-14 DIAGNOSIS — R7303 Prediabetes: Secondary | ICD-10-CM

## 2020-02-14 NOTE — Assessment & Plan Note (Signed)
Lipid profile reviewed On Atorvastatin 40 mg QD

## 2020-02-14 NOTE — Patient Instructions (Signed)
Please continue to take medications as prescribed.  Please follow DASH diet and perform moderate exercise/walking at least 150 mins/week.  DASH stands for Dietary Approaches to Stop Hypertension. The DASH diet is a healthy-eating plan designed to help treat or prevent high blood pressure (hypertension).  The DASH diet includes foods that are rich in potassium, calcium and magnesium. These nutrients help control blood pressure. The diet limits foods that are high in sodium, saturated fat and added sugars.  Studies have shown that the DASH diet can lower blood pressure in as little as two weeks. The diet can also lower low-density lipoprotein (LDL or "bad") cholesterol levels in the blood. High blood pressure and high LDL cholesterol levels are two major risk factors for heart disease and stroke.    DASH diet: Recommended servings The DASH diet provides daily and weekly nutritional goals. The number of servings you should have depends on your daily calorie needs.  Here's a look at the recommended servings from each food group for a 2,000-calorie-a-day DASH diet:  Grains: 6 to 8 servings a day. One serving is one slice bread, 1 ounce dry cereal, or 1/2 cup cooked cereal, rice or pasta. Vegetables: 4 to 5 servings a day. One serving is 1 cup raw leafy green vegetable, 1/2 cup cut-up raw or cooked vegetables, or 1/2 cup vegetable juice. Fruits: 4 to 5 servings a day. One serving is one medium fruit, 1/2 cup fresh, frozen or canned fruit, or 1/2 cup fruit juice. Fat-free or low-fat dairy products: 2 to 3 servings a day. One serving is 1 cup milk or yogurt, or 1 1/2 ounces cheese. Lean meats, poultry and fish: six 1-ounce servings or fewer a day. One serving is 1 ounce cooked meat, poultry or fish, or 1 egg. Nuts, seeds and legumes: 4 to 5 servings a week. One serving is 1/3 cup nuts, 2 tablespoons peanut butter, 2 tablespoons seeds, or 1/2 cup cooked legumes (dried beans or peas). Fats and oils: 2  to 3 servings a day. One serving is 1 teaspoon soft margarine, 1 teaspoon vegetable oil, 1 tablespoon mayonnaise or 2 tablespoons salad dressing. Sweets and added sugars: 5 servings or fewer a week. One serving is 1 tablespoon sugar, jelly or jam, 1/2 cup sorbet, or 1 cup lemonade.

## 2020-02-14 NOTE — Assessment & Plan Note (Signed)
Annual exam as documented. Counseling done  re healthy lifestyle involving commitment to 150 minutes exercise per week, heart healthy diet, and attaining healthy weight.The importance of adequate sleep also discussed. Changes in health habits are decided on by the patient with goals and time frames  set for achieving them. Immunization and cancer screening needs are specifically addressed at this visit. 

## 2020-02-14 NOTE — Progress Notes (Signed)
Established Patient Office Visit  Subjective:  Patient ID: Reginald Gonzales., male    DOB: 08/14/1965  Age: 54 y.o. MRN: [id number]  CC:  Chief Complaint  Patient presents with  . Annual Exam    annual exam no issues     HPI [name]. is a 54 year old male with PMH of HTN, HLD and seizure disorder who presents for annual physical exam.  He states that he has been in good health overall.  BP is well-controlled. Takes medications regularly. Patient denies headache, dizziness, chest pain, dyspnea or palpitations.  Blood tests were reviewed and discussed with the patient in detail.  Patient is advised to follow DASH diet and perform moderate exercise/walking at least 150 mins/week.  Past Medical History:  Diagnosis Date  . Acute epididymitis   . Hyperlipidemia   . Hypertension   . Obesity    mild  . Seizure disorder [facility])     Past Surgical History:  Procedure Laterality Date  . COLONOSCOPY N/A 12/21/2015   Procedure: COLONOSCOPY;  Surgeon: Daneil Dolin, MD;  Location: [facility];  Service: Endoscopy;  Laterality: N/A;  1:45 PM    Family History  Problem Relation Age of Onset  . Hypertension Mother   . Heart disease Mother   . Hypertension Father     Social History   Socioeconomic History  . Marital status: Married    Spouse name: Not on file  . Number of children: Not on file  . Years of education: Not on file  . Highest education level: Not on file  Occupational History  . Not on file  Tobacco Use  . Smoking status: Never Smoker  . Smokeless tobacco: Never Used  Substance and Sexual Activity  . Alcohol use: No  . Drug use: No  . Sexual activity: Not on file  Other Topics Concern  . Not on file  Social History Narrative  . Not on file   Social Determinants of Health   Financial Resource Strain:   . Difficulty of Paying Living Expenses: Not on file  Food Insecurity:   . Worried About Charity fundraiser in the Last Year: Not  on file  . Ran Out of Food in the Last Year: Not on file  Transportation Needs:   . Lack of Transportation (Medical): Not on file  . Lack of Transportation (Non-Medical): Not on file  Physical Activity:   . Days of Exercise per Week: Not on file  . Minutes of Exercise per Session: Not on file  Stress:   . Feeling of Stress : Not on file  Social Connections:   . Frequency of Communication with Friends and Family: Not on file  . Frequency of Social Gatherings with Friends and Family: Not on file  . Attends Religious Services: Not on file  . Active Member of Clubs or Organizations: Not on file  . Attends Archivist Meetings: Not on file  . Marital Status: Not on file  Intimate Partner Violence:   . Fear of Current or Ex-Partner: Not on file  . Emotionally Abused: Not on file  . Physically Abused: Not on file  . Sexually Abused: Not on file    Outpatient Medications Prior to Visit  Medication Sig Dispense Refill  . atorvastatin (LIPITOR) 40 MG tablet Take 1 tablet (40 mg total) by mouth daily. 90 tablet 3  . hydrochlorothiazide (HYDRODIURIL) 25 MG tablet TAKE 1 TABLET BY MOUTH EVERY DAY 90 tablet 1  .  phenytoin (DILANTIN) 100 MG ER capsule Take 1 capsule (100 mg total) by mouth 2 (two) times daily. 60 capsule 5   No facility-administered medications prior to visit.    No Known Allergies  ROS Review of Systems  Constitutional: Negative for chills and fever.  HENT: Negative for congestion and sore throat.   Eyes: Negative for pain and discharge.  Respiratory: Negative for cough and shortness of breath.   Cardiovascular: Negative for chest pain and palpitations.  Gastrointestinal: Negative for constipation, diarrhea, nausea and vomiting.  Endocrine: Negative for polydipsia and polyuria.  Genitourinary: Negative for dysuria and hematuria.  Musculoskeletal: Negative for neck pain and neck stiffness.  Skin: Negative for rash.  Neurological: Negative for dizziness,  weakness, numbness and headaches.  Psychiatric/Behavioral: Negative for agitation and behavioral problems.      Objective:    Physical Exam Vitals reviewed.  Constitutional:      General: He is not in acute distress.    Appearance: He is not diaphoretic.  HENT:     Head: Normocephalic and atraumatic.     Nose: Nose normal.     Mouth/Throat:     Mouth: Mucous membranes are moist.  Eyes:     General: No scleral icterus.    Extraocular Movements: Extraocular movements intact.     Pupils: Pupils are equal, round, and reactive to light.  Neck:     Vascular: No carotid bruit.  Cardiovascular:     Rate and Rhythm: Normal rate and regular rhythm.     Pulses: Normal pulses.     Heart sounds: Normal heart sounds. No murmur heard.   Pulmonary:     Breath sounds: Normal breath sounds. No wheezing or rales.  Abdominal:     Palpations: Abdomen is soft.     Tenderness: There is no abdominal tenderness.  Musculoskeletal:     Cervical back: Neck supple. No tenderness.     Right lower leg: No edema.     Left lower leg: No edema.  Skin:    General: Skin is warm.     Findings: No rash.  Neurological:     General: No focal deficit present.     Mental Status: He is alert and oriented to person, place, and time.     Sensory: No sensory deficit.     Motor: No weakness.  Psychiatric:        Mood and Affect: Mood normal.        Behavior: Behavior normal.     BP 136/85 (BP Location: Left Arm, Patient Position: Sitting)   Pulse 84   Temp 98.7 F (37.1 C) (Oral)   Resp 18   Ht 5\' 9"  (1.753 m)   Wt 235 lb 1.9 oz (106.6 kg)   SpO2 99%   BMI 34.72 kg/m  Wt Readings from Last 3 Encounters:  02/14/20 235 lb 1.9 oz (106.6 kg)  09/05/19 236 lb 1.9 oz (107.1 kg)  02/07/19 239 lb (108.4 kg)     Health Maintenance Due  Topic Date Due  . INFLUENZA VACCINE  10/09/2019    There are no preventive care reminders to display for this patient.  Lab Results  Component Value Date   TSH  1.64 09/09/2019   Lab Results  Component Value Date   WBC 5.2 09/09/2019   HGB 15.3 09/09/2019   HCT 43.3 09/09/2019   MCV 85.2 09/09/2019   PLT 147 09/09/2019   Lab Results  Component Value Date   NA 139 09/09/2019   K  3.7 09/09/2019   CO2 33 (H) 09/09/2019   GLUCOSE 94 09/09/2019   BUN 12 09/09/2019   CREATININE 0.73 09/09/2019   BILITOT 0.4 09/09/2019   ALKPHOS 77 08/11/2016   AST 8 (L) 09/09/2019   ALT 25 09/09/2019   PROT 7.6 09/09/2019   ALBUMIN 4.2 08/11/2016   CALCIUM 9.8 09/09/2019   Lab Results  Component Value Date   CHOL 240 (H) 09/09/2019   Lab Results  Component Value Date   HDL 58 09/09/2019   Lab Results  Component Value Date   LDLCALC 157 (H) 09/09/2019   Lab Results  Component Value Date   TRIG 123 09/09/2019   Lab Results  Component Value Date   CHOLHDL 4.1 09/09/2019   Lab Results  Component Value Date   HGBA1C 6.0 (H) 02/08/2019      Assessment & Plan:   Encounter for annual physical exam Annual exam as documented. Counseling done  re healthy lifestyle involving commitment to 150 minutes exercise per week, heart healthy diet, and attaining healthy weight.The importance of adequate sleep also discussed. Changes in health habits are decided on by the patient with goals and time frames  set for achieving them. Immunization and cancer screening needs are specifically addressed at this visit.  Prediabetes Lab Results  Component Value Date   HGBA1C 6.0 (H) 02/08/2019   CMP reviewed Advised low carbohydrate diet  ESSENTIAL HYPERTENSION, BENIGN BP Readings from Last 1 Encounters:  02/14/20 136/85   Well-controlled Continue HCTZ Counseled for compliance with the medications Advised DASH diet and moderate exercise/walking, at least 150 mins/week   Convulsions On Dilantin No episodes of seizure for many years  Hyperlipidemia LDL goal <100 Lipid profile reviewed On Atorvastatin 40 mg QD    No orders of the defined types  were placed in this encounter.   Follow-up: Return in about 4 months (around 06/14/2020).    Lindell Spar, MD

## 2020-02-14 NOTE — Assessment & Plan Note (Signed)
On Dilantin No episodes of seizure for many years

## 2020-02-14 NOTE — Assessment & Plan Note (Signed)
BP Readings from Last 1 Encounters:  02/14/20 136/85   Well-controlled Continue HCTZ Counseled for compliance with the medications Advised DASH diet and moderate exercise/walking, at least 150 mins/week

## 2020-02-14 NOTE — Assessment & Plan Note (Signed)
Lab Results  Component Value Date   HGBA1C 6.0 (H) 02/08/2019   CMP reviewed Advised low carbohydrate diet

## 2020-06-14 ENCOUNTER — Ambulatory Visit: Payer: BC Managed Care – PPO | Admitting: Family Medicine

## 2020-06-25 ENCOUNTER — Ambulatory Visit (INDEPENDENT_AMBULATORY_CARE_PROVIDER_SITE_OTHER): Payer: BC Managed Care – PPO | Admitting: Family Medicine

## 2020-06-25 ENCOUNTER — Encounter: Payer: Self-pay | Admitting: Family Medicine

## 2020-06-25 ENCOUNTER — Other Ambulatory Visit: Payer: Self-pay

## 2020-06-25 VITALS — BP 124/84 | HR 82 | Temp 97.5°F | Ht 69.0 in | Wt 233.0 lb

## 2020-06-25 DIAGNOSIS — E785 Hyperlipidemia, unspecified: Secondary | ICD-10-CM

## 2020-06-25 DIAGNOSIS — I10 Essential (primary) hypertension: Secondary | ICD-10-CM

## 2020-06-25 DIAGNOSIS — R569 Unspecified convulsions: Secondary | ICD-10-CM

## 2020-06-25 DIAGNOSIS — R7303 Prediabetes: Secondary | ICD-10-CM

## 2020-06-25 DIAGNOSIS — R7302 Impaired glucose tolerance (oral): Secondary | ICD-10-CM

## 2020-06-25 NOTE — Assessment & Plan Note (Signed)
Controlled, no change in medication DASH diet and commitment to daily physical activity for a minimum of 30 minutes discussed and encouraged, as a part of hypertension management. The importance of attaining a healthy weight is also discussed.  BP/Weight 06/25/2020 02/14/2020 09/05/2019 02/07/2019 07/28/2018 40/99/2780 0/06/4713  Systolic BP 806 386 854 883 014 159 733  Diastolic BP 84 85 78 82 82 82 82  Wt. (Lbs) 233 235.12 236.12 239 228 228 231  BMI 34.41 34.72 34.87 35.29 33.67 33.67 34.11

## 2020-06-25 NOTE — Assessment & Plan Note (Signed)
  Patient re-educated about  the importance of commitment to a  minimum of 150 minutes of exercise per week as able.  The importance of healthy food choices with portion control discussed, as well as eating regularly and within a 12 hour window most days. The need to choose "clean , green" food 50 to 75% of the time is discussed, as well as to make water the primary drink and set a goal of 64 ounces water daily.    Weight /BMI 06/25/2020 02/14/2020 09/05/2019  WEIGHT 233 lb 235 lb 1.9 oz 236 lb 1.9 oz  HEIGHT 5\' 9"  5\' 9"  5\' 9"   BMI 34.41 kg/m2 34.72 kg/m2 34.87 kg/m2

## 2020-06-25 NOTE — Progress Notes (Signed)
Reginald Gonzales.     MRN: [id number]      DOB: Sep 27, 1965   HPI Mr. Minnehan is here for follow up and re-evaluation of chronic medical conditions, medication management and review of any available recent lab and radiology data.  Preventive health is updated, specifically  Cancer screening and Immunization.   Questions or concerns regarding consultations or procedures which the PT has had in the interim are  addressed. The PT denies any adverse reactions to current medications since the last visit.  Had left knee and ankle pain and swelling a few weeks ago, relieved with tylenol and Aspercreme, no known trauma, first episode   ROS Denies recent fever or chills. Denies sinus pressure, nasal congestion, ear pain or sore throat. Denies chest congestion, productive cough or wheezing. Denies chest pains, palpitations and leg swelling Denies abdominal pain, nausea, vomiting,diarrhea or constipation.   Denies dysuria, frequency, hesitancy or incontinence. . Denies headaches, seizures, numbness, or tingling. Denies depression, anxiety or insomnia. Denies skin break down or rash.   PE  BP 124/84   Pulse 82   Temp (!) 97.5 F (36.4 C) (Temporal)   Ht 5\' 9"  (1.753 m)   Wt 233 lb (105.7 kg)   SpO2 98%   BMI 34.41 kg/m   Patient alert and oriented and in no cardiopulmonary distress.  HEENT: No facial asymmetry, EOMI,     Neck supple .  Chest: Clear to auscultation bilaterally.  CVS: S1, S2 no murmurs, no S3.Regular rate.  ABD: Soft non tender.   Ext: No edema  MS: Adequate ROM spine, shoulders, hips and knees.  Skin: Intact, no ulcerations or rash noted.  Psych: Good eye contact, normal affect. Memory intact not anxious or depressed appearing.  CNS: CN 2-12 intact, power,  normal throughout.no focal deficits noted.   Assessment & Plan  ESSENTIAL HYPERTENSION, BENIGN Controlled, no change in medication DASH diet and commitment to daily physical activity for a  minimum of 30 minutes discussed and encouraged, as a part of hypertension management. The importance of attaining a healthy weight is also discussed.  BP/Weight 06/25/2020 02/14/2020 09/05/2019 02/07/2019 07/28/2018 40/98/1191 06/14/8293  Systolic BP 621 308 657 846 962 952 841  Diastolic BP 84 85 78 82 82 82 82  Wt. (Lbs) 233 235.12 236.12 239 228 228 231  BMI 34.41 34.72 34.87 35.29 33.67 33.67 34.11       Morbid obesity (HCC)  Patient re-educated about  the importance of commitment to a  minimum of 150 minutes of exercise per week as able.  The importance of healthy food choices with portion control discussed, as well as eating regularly and within a 12 hour window most days. The need to choose "clean , green" food 50 to 75% of the time is discussed, as well as to make water the primary drink and set a goal of 64 ounces water daily.    Weight /BMI 06/25/2020 02/14/2020 09/05/2019  WEIGHT 233 lb 235 lb 1.9 oz 236 lb 1.9 oz  HEIGHT 5\' 9"  5\' 9"  5\' 9"   BMI 34.41 kg/m2 34.72 kg/m2 34.87 kg/m2      Prediabetes Patient educated about the importance of limiting  Carbohydrate intake , the need to commit to daily physical activity for a minimum of 30 minutes , and to commit weight loss. The fact that changes in all these areas will reduce or eliminate all together the development of diabetes is stressed.    Diabetic Labs Latest Ref Rng & Units  09/09/2019 02/08/2019 07/30/2018 02/02/2018 06/19/2017  HbA1c <5.7 % of total Hgb - 6.0(H) - 6.0(H) 6.1(H)  Chol <200 mg/dL 240(H) - 196 175 161  HDL > OR = 40 mg/dL 58 - 55 59 55  Calc LDL mg/dL (calc) 157(H) - 114(H) 92 85  Triglycerides <150 mg/dL 123 - 158(H) 139 115  Creatinine 0.70 - 1.33 mg/dL 0.73 0.86 0.91 0.82 0.85   BP/Weight 06/25/2020 02/14/2020 09/05/2019 02/07/2019 07/28/2018 26/33/3545 08/09/5636  Systolic BP 937 342 876 811 572 620 355  Diastolic BP 84 85 78 82 82 82 82  Wt. (Lbs) 233 235.12 236.12 239 228 228 231  BMI 34.41 34.72 34.87  35.29 33.67 33.67 34.11   No flowsheet data found.    Convulsions Controlled, no change in medication Updated lab needed at/ before next visit.

## 2020-06-25 NOTE — Progress Notes (Signed)
Reginald Gonzales.     MRN: [id number]      DOB: Aug 25, 1965   HPI [name] is here for follow up and re-evaluation of chronic medical conditions, medication management and review of any available recent lab and radiology data.  Preventive health is updated, specifically  Cancer screening and Immunization.   Questions or concerns regarding consultations or procedures which the PT has had in the interim are  addressed. The PT denies any adverse reactions to current medications since the last visit.  Had left knee and ankle pain and swelling a few weeks ago, relieved with tylenol and Aspercreme, no known trauma, first episode   ROS Denies recent fever or chills. Denies sinus pressure, nasal congestion, ear pain or sore throat. Denies chest congestion, productive cough or wheezing. Denies chest pains, palpitations and leg swelling Denies abdominal pain, nausea, vomiting,diarrhea or constipation.   Denies dysuria, frequency, hesitancy or incontinence. . Denies headaches, seizures, numbness, or tingling. Denies depression, anxiety or insomnia. Denies skin break down or rash.   PE  BP 124/84   Pulse 82   Temp (!) 97.5 F (36.4 C) (Temporal)   Ht 5\' 9"  (1.753 m)   Wt 233 lb (105.7 kg)   SpO2 98%   BMI 34.41 kg/m   Patient alert and oriented and in no cardiopulmonary distress.  HEENT: No facial asymmetry, EOMI,     Neck supple .  Chest: Clear to auscultation bilaterally.  CVS: S1, S2 no murmurs, no S3.Regular rate.  ABD: Soft non tender.   Ext: No edema  MS: Adequate ROM spine, shoulders, hips and knees.  Skin: Intact, no ulcerations or rash noted.  Psych: Good eye contact, normal affect. Memory intact not anxious or depressed appearing.  CNS: CN 2-12 intact, power,  normal throughout.no focal deficits noted.   Assessment & Plan  ESSENTIAL HYPERTENSION, BENIGN Controlled, no change in medication DASH diet and commitment to daily physical activity for a  minimum of 30 minutes discussed and encouraged, as a part of hypertension management. The importance of attaining a healthy weight is also discussed.  BP/Weight 06/25/2020 02/14/2020 09/05/2019 02/07/2019 07/28/2018 36/64/4034 09/10/2593  Systolic BP 638 756 433 295 188 416 606  Diastolic BP 84 85 78 82 82 82 82  Wt. (Lbs) 233 235.12 236.12 239 228 228 231  BMI 34.41 34.72 34.87 35.29 33.67 33.67 34.11       Morbid obesity (HCC)  Patient re-educated about  the importance of commitment to a  minimum of 150 minutes of exercise per week as able.  The importance of healthy food choices with portion control discussed, as well as eating regularly and within a 12 hour window most days. The need to choose "clean , green" food 50 to 75% of the time is discussed, as well as to make water the primary drink and set a goal of 64 ounces water daily.    Weight /BMI 06/25/2020 02/14/2020 09/05/2019  WEIGHT 233 lb 235 lb 1.9 oz 236 lb 1.9 oz  HEIGHT 5\' 9"  5\' 9"  5\' 9"   BMI 34.41 kg/m2 34.72 kg/m2 34.87 kg/m2      Prediabetes Patient educated about the importance of limiting  Carbohydrate intake , the need to commit to daily physical activity for a minimum of 30 minutes , and to commit weight loss. The fact that changes in all these areas will reduce or eliminate all together the development of diabetes is stressed.    Diabetic Labs Latest Ref Rng & Units  09/09/2019 02/08/2019 07/30/2018 02/02/2018 06/19/2017  HbA1c <5.7 % of total Hgb - 6.0(H) - 6.0(H) 6.1(H)  Chol <200 mg/dL 240(H) - 196 175 161  HDL > OR = 40 mg/dL 58 - 55 59 55  Calc LDL mg/dL (calc) 157(H) - 114(H) 92 85  Triglycerides <150 mg/dL 123 - 158(H) 139 115  Creatinine 0.70 - 1.33 mg/dL 0.73 0.86 0.91 0.82 0.85   BP/Weight 06/25/2020 02/14/2020 09/05/2019 02/07/2019 07/28/2018 42/39/5320 04/12/3433  Systolic BP 686 168 372 902 111 552 080  Diastolic BP 84 85 78 82 82 82 82  Wt. (Lbs) 233 235.12 236.12 239 228 228 231  BMI 34.41 34.72 34.87  35.29 33.67 33.67 34.11   No flowsheet data found.    Convulsions Controlled, no change in medication Updated lab needed at/ before next visit.

## 2020-06-25 NOTE — Patient Instructions (Signed)
Annual physical exam in office with MD end August  Flu vaccine at visit, call if you need me before  PLease get covid booster at your pharmacy  Fasting lipid, cmp and eGFR and dilantin level in the next 1 week  It is important that you exercise regularly at least 30 minutes 5 times a week. If you develop chest pain, have severe difficulty breathing, or feel very tired, stop exercising immediately and seek medical attention   Think about what you will eat, plan ahead. Choose " clean, green, fresh or frozen" over canned, processed or packaged foods which are more sugary, salty and fatty. 70 to 75% of food eaten should be vegetables and fruit. Three meals at set times with snacks allowed between meals, but they must be fruit or vegetables. Aim to eat over a 12 hour period , example 7 am to 7 pm, and STOP after  your last meal of the day. Drink water,generally about 64 ounces per day, no other drink is as healthy. Fruit juice is best enjoyed in a healthy way, by EATING the fruit. Thanks for choosing [facility], we consider it a privelige to serve you.

## 2020-06-25 NOTE — Assessment & Plan Note (Signed)
Patient educated about the importance of limiting  Carbohydrate intake , the need to commit to daily physical activity for a minimum of 30 minutes , and to commit weight loss. The fact that changes in all these areas will reduce or eliminate all together the development of diabetes is stressed.    Diabetic Labs Latest Ref Rng & Units 09/09/2019 02/08/2019 07/30/2018 02/02/2018 06/19/2017  HbA1c <5.7 % of total Hgb - 6.0(H) - 6.0(H) 6.1(H)  Chol <200 mg/dL 240(H) - 196 175 161  HDL > OR = 40 mg/dL 58 - 55 59 55  Calc LDL mg/dL (calc) 157(H) - 114(H) 92 85  Triglycerides <150 mg/dL 123 - 158(H) 139 115  Creatinine 0.70 - 1.33 mg/dL 0.73 0.86 0.91 0.82 0.85   BP/Weight 06/25/2020 02/14/2020 09/05/2019 02/07/2019 07/28/2018 25/95/6387 07/14/4330  Systolic BP 951 884 166 063 016 010 932  Diastolic BP 84 85 78 82 82 82 82  Wt. (Lbs) 233 235.12 236.12 239 228 228 231  BMI 34.41 34.72 34.87 35.29 33.67 33.67 34.11   No flowsheet data found.

## 2020-06-25 NOTE — Assessment & Plan Note (Signed)
Controlled, no change in medication Updated lab needed at/ before next visit.  

## 2020-07-04 ENCOUNTER — Other Ambulatory Visit: Payer: Self-pay | Admitting: Family Medicine

## 2020-09-05 ENCOUNTER — Other Ambulatory Visit: Payer: Self-pay | Admitting: Family Medicine

## 2020-10-19 ENCOUNTER — Other Ambulatory Visit: Payer: Self-pay | Admitting: Family Medicine

## 2020-11-01 LAB — CMP14+EGFR
ALT: 21 IU/L (ref 0–44)
AST: 22 IU/L (ref 0–40)
Albumin/Globulin Ratio: 1.6 (ref 1.2–2.2)
Albumin: 4.6 g/dL (ref 3.8–4.9)
Alkaline Phosphatase: 81 IU/L (ref 44–121)
BUN/Creatinine Ratio: 14 (ref 9–20)
BUN: 11 mg/dL (ref 6–24)
Bilirubin Total: 0.4 mg/dL (ref 0.0–1.2)
CO2: 28 mmol/L (ref 20–29)
Calcium: 9.9 mg/dL (ref 8.7–10.2)
Chloride: 98 mmol/L (ref 96–106)
Creatinine, Ser: 0.78 mg/dL (ref 0.76–1.27)
Globulin, Total: 2.8 g/dL (ref 1.5–4.5)
Glucose: 97 mg/dL (ref 65–99)
Potassium: 3.6 mmol/L (ref 3.5–5.2)
Sodium: 139 mmol/L (ref 134–144)
Total Protein: 7.4 g/dL (ref 6.0–8.5)
eGFR: 106 mL/min/{1.73_m2} (ref >59)

## 2020-11-01 LAB — LIPID PANEL
Chol/HDL Ratio: 2.4 ratio (ref 0.0–5.0)
Cholesterol, Total: 142 mg/dL (ref 100–199)
HDL: 58 mg/dL (ref >39)
LDL Chol Calc (NIH): 70 mg/dL (ref 0–99)
Triglycerides: 69 mg/dL (ref 0–149)
VLDL Cholesterol Cal: 14 mg/dL (ref 5–40)

## 2020-11-01 LAB — PHENYTOIN LEVEL, TOTAL: Phenytoin (Dilantin), Serum: 4 ug/mL — ABNORMAL LOW (ref 10.0–20.0)

## 2020-11-02 NOTE — Progress Notes (Signed)
Pt aware.

## 2020-11-05 ENCOUNTER — Encounter: Payer: BC Managed Care – PPO | Admitting: Family Medicine

## 2020-11-08 ENCOUNTER — Encounter: Payer: Self-pay | Admitting: Family Medicine

## 2020-11-08 ENCOUNTER — Other Ambulatory Visit: Payer: Self-pay

## 2020-11-08 ENCOUNTER — Ambulatory Visit (INDEPENDENT_AMBULATORY_CARE_PROVIDER_SITE_OTHER): Payer: BC Managed Care – PPO | Admitting: Family Medicine

## 2020-11-08 VITALS — BP 124/78 | HR 93 | Temp 98.9°F | Resp 20 | Ht 69.0 in | Wt 230.0 lb

## 2020-11-08 DIAGNOSIS — Z125 Encounter for screening for malignant neoplasm of prostate: Secondary | ICD-10-CM

## 2020-11-08 DIAGNOSIS — Z0000 Encounter for general adult medical examination without abnormal findings: Secondary | ICD-10-CM

## 2020-11-08 DIAGNOSIS — E785 Hyperlipidemia, unspecified: Secondary | ICD-10-CM

## 2020-11-08 DIAGNOSIS — Z23 Encounter for immunization: Secondary | ICD-10-CM

## 2020-11-08 DIAGNOSIS — I10 Essential (primary) hypertension: Secondary | ICD-10-CM | POA: Diagnosis not present

## 2020-11-08 DIAGNOSIS — E559 Vitamin D deficiency, unspecified: Secondary | ICD-10-CM

## 2020-11-08 DIAGNOSIS — R7303 Prediabetes: Secondary | ICD-10-CM

## 2020-11-08 DIAGNOSIS — R0683 Snoring: Secondary | ICD-10-CM | POA: Diagnosis not present

## 2020-11-08 DIAGNOSIS — D126 Benign neoplasm of colon, unspecified: Secondary | ICD-10-CM

## 2020-11-08 DIAGNOSIS — R569 Unspecified convulsions: Secondary | ICD-10-CM

## 2020-11-08 DIAGNOSIS — Z0001 Encounter for general adult medical examination with abnormal findings: Secondary | ICD-10-CM

## 2020-11-08 DIAGNOSIS — E8881 Metabolic syndrome: Secondary | ICD-10-CM

## 2020-11-08 MED ORDER — PHENYTOIN SODIUM EXTENDED 200 MG PO CAPS: ORAL_CAPSULE | ORAL | 3 refills | Status: DC

## 2020-11-08 NOTE — Assessment & Plan Note (Signed)

## 2020-11-08 NOTE — Progress Notes (Signed)
   Reginald Gonzales.     MRN: [id number]      DOB: Aug 21, 1965   HPI: Patient is in for annual physical exam. C/o excess daytime sleepiness and snoring Dilantin level needs to be increased as subtherapeutic Recent labs, if available are reviewed. Immunization is reviewed , and  updated     PE; BP 124/78   Pulse 93   Temp 98.9 F (37.2 C)   Resp 20   Ht '5\' 9"'$  (1.753 m)   Wt 230 lb (104.3 kg)   SpO2 96%   BMI 33.97 kg/m   Pleasant male, alert and oriented x 3, in no cardio-pulmonary distress. Afebrile. HEENT No facial trauma or asymetry. Sinuses non tender. EOMI External ears normal,  Neck: supple, no adenopathy,JVD or thyromegaly.No bruits.  Chest: Clear to ascultation bilaterally.No crackles or wheezes. Non tender to palpation  Cardiovascular system; Heart sounds normal,  S1 and  S2 ,no S3.  No murmur, or thrill. Apical beat not displaced Peripheral pulses normal.  Abdomen: Soft, non tender, no organomegaly or masses. No bruits. Bowel sounds normal. No guarding, tenderness or rebound.    Musculoskeletal exam: Full ROM of spine, hips , shoulders and knees. No deformity ,swelling or crepitus noted. No muscle wasting or atrophy.   Neurologic: Cranial nerves 2 to 12 intact. Power, tone ,sensation and reflexes normal throughout. No disturbance in gait. No tremor.  Skin: Intact, no ulceration, erythema , scaling or rash noted. Pigmentation normal throughout  Psych; Normal mood and affect. Judgement and concentration normal   Assessment & Plan:  Annual physical exam Annual exam as documented. Counseling done  re healthy lifestyle involving commitment to 150 minutes exercise per week, heart healthy diet, and attaining healthy weight.The importance of adequate sleep also discussed. Regular seat belt use and home safety, is also discussed. Changes in health habits are decided on by the patient with goals and time frames  set for achieving  them. Immunization and cancer screening needs are specifically addressed at this visit.   Snoring Snoring, thick neck, excess daytime sleepiness and fatigue, refer for eval for OSA  Convulsions Sub therapeutic level on recent test ,dilantin  dose adjusted and to be re checked in 6 weeks

## 2020-11-08 NOTE — Patient Instructions (Addendum)
Follow-up in office with MD in 6 months call if you need me sooner.  Flu vaccine at visit.  Please document vision screen prior to checkout.  New higher dose of Dilantin is 200 mg 2 at bedtime.  You need the second week in October to get nonfasting Dilantin level ,vitamin D, CBC, PSA and TSH.  You are referred for colonoscopy due to having polyps in the past.    You are referred for evaluation for sleep apnea , please get appointment. At checkout  Congrats on weight loss keep it going!  Work on getting at least 6 hrs sleep each day  Thanks for choosing [facility], we consider it a privelige to serve you.

## 2020-11-12 ENCOUNTER — Encounter: Payer: Self-pay | Admitting: Family Medicine

## 2020-11-12 DIAGNOSIS — R0683 Snoring: Secondary | ICD-10-CM | POA: Insufficient documentation

## 2020-11-12 NOTE — Assessment & Plan Note (Signed)
Snoring, thick neck, excess daytime sleepiness and fatigue, refer for eval for OSA

## 2020-11-12 NOTE — Assessment & Plan Note (Signed)
Sub therapeutic level on recent test ,dilantin  dose adjusted and to be re checked in 6 weeks

## 2020-11-14 ENCOUNTER — Encounter: Payer: Self-pay | Admitting: Internal Medicine

## 2020-11-19 ENCOUNTER — Encounter: Payer: Self-pay | Admitting: *Deleted

## 2020-12-27 ENCOUNTER — Ambulatory Visit: Payer: BC Managed Care – PPO

## 2020-12-27 ENCOUNTER — Encounter: Payer: Self-pay | Admitting: Internal Medicine

## 2021-01-02 ENCOUNTER — Telehealth: Payer: Self-pay

## 2021-01-02 NOTE — Telephone Encounter (Signed)
Pt called stating that he missed his appt that was scheduled for 12/27/2020 and would like to reschedule his appointment. Please contact pt with new appointment.

## 2021-01-10 ENCOUNTER — Ambulatory Visit (INDEPENDENT_AMBULATORY_CARE_PROVIDER_SITE_OTHER): Payer: Self-pay | Admitting: *Deleted

## 2021-01-10 ENCOUNTER — Encounter: Payer: Self-pay | Admitting: *Deleted

## 2021-01-10 ENCOUNTER — Other Ambulatory Visit: Payer: Self-pay

## 2021-01-10 VITALS — Ht 69.0 in | Wt 231.6 lb

## 2021-01-10 DIAGNOSIS — Z86010 Personal history of colonic polyps: Secondary | ICD-10-CM

## 2021-01-10 MED ORDER — CLENPIQ 10-3.5-12 MG-GM -GM/160ML PO SOLN: 1.0000 | Freq: Once | ORAL | 0 refills | Status: AC

## 2021-01-10 NOTE — Progress Notes (Signed)
Gastroenterology Pre-Procedure Review  Request Date: 01/10/2021 Requesting Physician: Dr. 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 Diabetes Melitis: no 2. Joint replacements in the past 12 months: no 3. Major health problems in the past 3 months: no 4. Has an artificial valve or MVP: no 5. Has a defibrillator: no 6. Has been advised in past to take antibiotics in advance of a procedure like teeth cleaning: no 7. Family history of colon cancer: no  8. Alcohol Use: no 9. Illicit drug Use: no 10. History of sleep apnea: no  11. History of coronary artery or other vascular stents placed within the last 12 months: no 12. History of any prior anesthesia complications: no 13. Body mass index is 34.2 kg/m.    MEDICATIONS & ALLERGIES:    Patient reports the following regarding taking any blood thinners:   Plavix? no Aspirin? no Coumadin? no Brilinta? no Xarelto? no Eliquis? no Pradaxa? no Savaysa? no Effient? no  Patient confirms/reports the following medications:  Current Outpatient Medications  Medication Sig Dispense Refill   atorvastatin (LIPITOR) 40 MG tablet TAKE 1 TABLET BY MOUTH EVERY DAY 90 tablet 3   Cholecalciferol (VITAMIN D3) 50 MCG (2000 UT) TABS Take by mouth as needed.     hydrochlorothiazide (HYDRODIURIL) 25 MG tablet TAKE 1 TABLET BY MOUTH EVERY DAY 90 tablet 1   phenytoin (DILANTIN) 200 MG ER capsule Take two capsules by mouth every day at bedtime 60 capsule 3   No current facility-administered medications for this visit.    Patient confirms/reports the following allergies:  No Known Allergies  No orders of the defined types were placed in this encounter.   AUTHORIZATION INFORMATION Primary Insurance: [facility],  [place] #: [id number],  Group #: [id number] Pre-Cert / Josem Kaufmann required: No, not  required  SCHEDULE INFORMATION: Procedure has been scheduled as follows:  Date: 02/13/2021, Time: 1:00  Location: [facility] with Dr. Gala Romney  This Gastroenterology Pre-Precedure Review Form is being routed to the following provider(s): Neil Crouch, PA-C

## 2021-01-14 ENCOUNTER — Encounter: Payer: Self-pay | Admitting: Pulmonary Disease

## 2021-01-14 ENCOUNTER — Ambulatory Visit (INDEPENDENT_AMBULATORY_CARE_PROVIDER_SITE_OTHER): Payer: BC Managed Care – PPO | Admitting: Pulmonary Disease

## 2021-01-14 ENCOUNTER — Other Ambulatory Visit: Payer: Self-pay

## 2021-01-14 DIAGNOSIS — R0683 Snoring: Secondary | ICD-10-CM

## 2021-01-14 DIAGNOSIS — R569 Unspecified convulsions: Secondary | ICD-10-CM

## 2021-01-14 NOTE — Assessment & Plan Note (Signed)
Due to his history of loud snoring and excessive daytime somnolence, reasonable to proceed with a home sleep test to clarify. Hypersomnolence may be due to decreased sleep quantity, I have asked him to ensure a bedtime of at least 10 AM and perhaps even earlier at 9 AM is that he gets 6 hours of sleep on weekdays.  He can add a nap for 1 to 2 hours in the afternoon as needed.  On weekends he seems to do well with his sleep quantiity.  The pathophysiology of obstructive sleep apnea , it's cardiovascular consequences & modes of treatment including CPAP were discused with the patient in detail & they evidenced understanding.

## 2021-01-14 NOTE — Addendum Note (Signed)
Addended by: Fritzi Mandes D on: 01/14/2021 09:52 AM   Modules accepted: Orders

## 2021-01-14 NOTE — Progress Notes (Signed)
Subjective:    Patient ID: Reginald Mann., male    DOB: 01/23/1966, 55 y.o.   MRN: [id number]  HPI  Chief Complaint  Patient presents with   Consult    Sleep apnea consult. Snoring, headaches in mornings, excessive sleepiness.    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 Loud snoring has been noted by his wife.  He also reports excessive daytime somnolence. Epworth sleepiness score is 12 and he reports sleepiness while sitting and reading, watching TV, lying down to rest in the afternoons. He has been doing this job for 6 years and has been waking up at 3 AM to get to work by 5 AM.  He finishes work at 2 PM and then goes home and will occasionally nap for 1 to 2 hours on the couch.  He reports occasional morning headaches. Bedtime is between 10 and 11 PM, he takes care off his sisters grandchild and this causes a late bedtime, sleep latency can be about 15 minutes, sleeps on his left side with 2 pillows, denies nocturnal awakenings or nocturia and is out of bed at 3 AM feeling tired with occasional headaches and dryness of mouth.  On weekends he will stay in bed until 7 AM. He reports drinking 2 to 3 cups of caffeine daily  PMH -seizure disorder, hypertension, hyperlipidemia.  Last PCP visit was reviewed and Dilantin levels were noted to be subtherapeutic  There is no history suggestive of cataplexy, sleep paralysis or parasomnias        Past Medical History:  Diagnosis Date   Acute epididymitis    Hyperlipidemia    Hypertension    Obesity    mild   Seizure disorder [facility])    Past Surgical History:  Procedure Laterality Date   COLONOSCOPY N/A 12/21/2015   Procedure: COLONOSCOPY;  Surgeon: Daneil Dolin, MD;  Location: [facility];  Service: Endoscopy;  Laterality: N/A;  1:45 PM    No Known Allergies  Social History   Socioeconomic History   Marital status: Married    Spouse name: Not on file    Number of children: Not on file   Years of education: Not on file   Highest education level: Not on file  Occupational History   Not on file  Tobacco Use   Smoking status: Never   Smokeless tobacco: Never  Substance and Sexual Activity   Alcohol use: No   Drug use: No   Sexual activity: Not on file  Other Topics Concern   Not on file  Social History Narrative   Not on file   Social Determinants of Health   Financial Resource Strain: Not on file  Food Insecurity: Not on file  Transportation Needs: Not on file  Physical Activity: Not on file  Stress: Not on file  Social Connections: Not on file  Intimate Partner Violence: Not on file   Family History  Problem Relation Age of Onset   Hypertension Mother    Heart disease Mother    Hypertension Father      Review of Systems   Constitutional: negative for anorexia, fevers and sweats  Eyes: negative for irritation, redness and visual disturbance  Ears, nose, mouth, throat, and face: negative for earaches, epistaxis, nasal congestion and sore throat  Respiratory: negative for cough, dyspnea on exertion, sputum and wheezing  Cardiovascular: negative for chest pain, dyspnea, lower extremity edema, orthopnea, palpitations and syncope  Gastrointestinal: negative for abdominal pain,  constipation, diarrhea, melena, nausea and vomiting  Genitourinary:negative for dysuria, frequency and hematuria  Hematologic/lymphatic: negative for bleeding, easy bruising and lymphadenopathy  Musculoskeletal:negative for arthralgias, muscle weakness and stiff joints  Neurological: negative for coordination problems, gait problems, headaches and weakness  Endocrine: negative for diabetic symptoms including polydipsia, polyuria and weight loss     Objective:   Physical Exam  Gen. Pleasant, obese, in no distress, normal affect ENT - no pallor,icterus, no post nasal drip, class 2 airway Neck: No JVD, no thyromegaly, no carotid bruits Lungs:  no use of accessory muscles, no dullness to percussion, decreased without rales or rhonchi  Cardiovascular: Rhythm regular, heart sounds  normal, no murmurs or gallops, no peripheral edema Abdomen: soft and non-tender, no hepatosplenomegaly, BS normal. Musculoskeletal: No deformities, no cyanosis or clubbing Neuro:  alert, non focal, no tremors       Assessment & Plan:

## 2021-01-14 NOTE — Assessment & Plan Note (Signed)
Effect of OSA on seizure disorder and hypertension was discussed

## 2021-01-14 NOTE — Patient Instructions (Signed)
Home sleep study 

## 2021-01-14 NOTE — Progress Notes (Signed)
Ok to schedule with conscious sedation. ASA II.

## 2021-02-13 ENCOUNTER — Encounter (HOSPITAL_COMMUNITY)
Admission: RE | Disposition: A | Discharge status: Home / Self Care | Payer: Self-pay | Source: Home / Self Care | Attending: Internal Medicine

## 2021-02-13 ENCOUNTER — Ambulatory Visit (HOSPITAL_COMMUNITY)
Admission: RE | Admit: 2021-02-13 | Discharge: 2021-02-13 | Disposition: A | Discharge status: Home / Self Care | Payer: BC Managed Care – PPO | Attending: Internal Medicine | Admitting: Internal Medicine

## 2021-02-13 ENCOUNTER — Other Ambulatory Visit: Payer: Self-pay

## 2021-02-13 ENCOUNTER — Encounter (HOSPITAL_COMMUNITY): Payer: Self-pay | Admitting: Internal Medicine

## 2021-02-13 DIAGNOSIS — Z1211 Encounter for screening for malignant neoplasm of colon: Secondary | ICD-10-CM | POA: Insufficient documentation

## 2021-02-13 DIAGNOSIS — Z86010 Personal history of colonic polyps: Secondary | ICD-10-CM | POA: Insufficient documentation

## 2021-02-13 HISTORY — PX: COLONOSCOPY: SHX5424

## 2021-02-13 SURGERY — COLONOSCOPY
Anesthesia: Moderate Sedation

## 2021-02-13 MED ORDER — ONDANSETRON HCL 4 MG/2ML IJ SOLN
INTRAMUSCULAR | Status: DC | PRN
  Administered 2021-02-13: 4 mg via INTRAVENOUS

## 2021-02-13 MED ORDER — MEPERIDINE HCL 100 MG/ML IJ SOLN
INTRAMUSCULAR | Status: DC | PRN
  Administered 2021-02-13: 40 mg via INTRAVENOUS

## 2021-02-13 MED ORDER — MEPERIDINE HCL 50 MG/ML IJ SOLN
INTRAMUSCULAR | Status: AC
  Filled 2021-02-13: qty 1

## 2021-02-13 MED ORDER — MIDAZOLAM HCL 5 MG/5ML IJ SOLN
INTRAMUSCULAR | Status: DC | PRN
  Administered 2021-02-13: 1 mg via INTRAVENOUS
  Administered 2021-02-13: 2 mg via INTRAVENOUS

## 2021-02-13 MED ORDER — ONDANSETRON HCL 4 MG/2ML IJ SOLN
INTRAMUSCULAR | Status: AC
  Filled 2021-02-13: qty 2

## 2021-02-13 MED ORDER — SODIUM CHLORIDE 0.9 % IV SOLN
INTRAVENOUS | Status: DC
  Administered 2021-02-13: 1000 mL via INTRAVENOUS

## 2021-02-13 MED ORDER — MIDAZOLAM HCL 5 MG/5ML IJ SOLN
INTRAMUSCULAR | Status: AC
  Filled 2021-02-13: qty 10

## 2021-02-13 NOTE — Op Note (Signed)
[facility] Patient Name: Reginald Gonzales Procedure Date: 02/13/2021 12:42 PM MRN: [id number] Date of Birth: Jul 02, 1965 Attending MD: Norvel Richards , MD CSN: [id number] Age: 55 Admit Type: Outpatient Procedure:                Colonoscopy Indications:              High risk colon cancer surveillance: Personal                            history of colonic polyps Providers:                Norvel Richards, MD, Caprice Kluver, Randa Spike, Technician Referring MD:              Medicines:                Midazolam 3 mg IV, Meperidine 40 mg IV Complications:            No immediate complications. Estimated Blood Loss:     Estimated blood loss: none. Procedure:                Pre-Anesthesia Assessment:                           - Prior to the procedure, a History and Physical                            was performed, and patient medications and                            allergies were reviewed. The patient's tolerance of                            previous anesthesia was also reviewed. The risks                            and benefits of the procedure and the sedation                            options and risks were discussed with the patient.                            All questions were answered, and informed consent                            was obtained. Prior Anticoagulants: The patient has                            taken no previous anticoagulant or antiplatelet                            agents. ASA Grade Assessment: II - A patient with  mild systemic disease. After reviewing the risks                            and benefits, the patient was deemed in                            satisfactory condition to undergo the procedure.                           After obtaining informed consent, the colonoscope                            was passed under direct vision. Throughout the                            procedure, the  patient's blood pressure, pulse, and                            oxygen saturations were monitored continuously. The                            [id number]) scope was introduced through the                            anus and advanced to the the cecum, identified by                            appendiceal orifice and ileocecal valve. The                            colonoscopy was performed without difficulty. The                            patient tolerated the procedure well. The quality                            of the bowel preparation was adequate. Scope In: 12:59:48 PM Scope Out: 1:11:51 PM Scope Withdrawal Time: 0 hours 7 minutes 16 seconds  Total Procedure Duration: 0 hours 12 minutes 3 seconds  Findings:      The perianal and digital rectal examinations were normal.      The colon (entire examined portion) appeared normal.      The retroflexed view of the distal rectum and anal verge was normal and       showed no anal or rectal abnormalities. Impression:               - The entire examined colon is normal.                           - The distal rectum and anal verge are normal on                            retroflexion view.                           -  No specimens collected. Moderate Sedation:      Moderate (conscious) sedation was administered by the endoscopy nurse       and supervised by the endoscopist. The following parameters were       monitored: oxygen saturation, heart rate, blood pressure, respiratory       rate, EKG, adequacy of pulmonary ventilation, and response to care.       Total physician intraservice time was 15 minutes. Recommendation:           - Patient has a contact number available for                            emergencies. The signs and symptoms of potential                            delayed complications were discussed with the                            patient. Return to normal activities tomorrow.                            Written discharge  instructions were provided to the                            patient.                           - Resume previous diet.                           - Continue present medications.                           - Repeat colonoscopy in 7 years for surveillance.                           - Return to GI office (date not yet determined). Procedure Code(s):        --- Professional ---                           [phone], Colonoscopy, flexible; diagnostic, including                            collection of specimen(s) by brushing or washing,                            when performed (separate procedure)                           G0500, Moderate sedation services provided by the                            same physician or other qualified health care                            professional performing a gastrointestinal  endoscopic service that sedation supports,                            requiring the presence of an independent trained                            observer to assist in the monitoring of the                            patient's level of consciousness and physiological                            status; initial 15 minutes of intra-service time;                            patient age 20 years or older (additional time may                            be reported with [phone], as appropriate) Diagnosis Code(s):        --- Professional ---                           Z86.010, Personal history of colonic polyps CPT copyright 2019 [employer]. All rights reserved. The codes documented in this report are preliminary and upon coder review may  be revised to meet current compliance requirements. Cristopher Estimable. Ann Bohne, MD Norvel Richards, MD 02/13/2021 1:16:42 PM This report has been signed electronically. Number of Addenda: 0

## 2021-02-13 NOTE — H&P (Signed)
@LOGO @   Primary Care Physician:  Fayrene Helper, MD Primary Gastroenterologist:  Dr. Gala Romney  Pre-Procedure History & Physical: HPI:  Reginald Gonzales. is a 55 y.o. male here for surveillance colonoscopy.  History of adenomatous polyp removed from hepatic flexure 5 years ago.  He is having no bowel symptoms now.  He is here for surveillance colonoscopy  Past Medical History:  Diagnosis Date   Acute epididymitis    Hyperlipidemia    Hypertension    Obesity    mild   Seizure disorder [facility])     Past Surgical History:  Procedure Laterality Date   COLONOSCOPY N/A 12/21/2015   Procedure: COLONOSCOPY;  Surgeon: Daneil Dolin, MD;  Location: [facility]ITE;  Service: Endoscopy;  Laterality: N/A;  1:45 PM    Prior to Admission medications   Medication Sig Start Date End Date Taking? Authorizing Provider  atorvastatin (LIPITOR) 40 MG tablet TAKE 1 TABLET BY MOUTH EVERY DAY 09/05/20  Yes Fayrene Helper, MD  hydrochlorothiazide (HYDRODIURIL) 25 MG tablet TAKE 1 TABLET BY MOUTH EVERY DAY 10/19/20  Yes Fayrene Helper, MD  phenytoin (DILANTIN) 200 MG ER capsule Take two capsules by mouth every day at bedtime 11/08/20  Yes Fayrene Helper, MD    Allergies as of 01/15/2021   (No Known Allergies)    Family History  Problem Relation Age of Onset   Hypertension Mother    Heart disease Mother    Hypertension Father     Social History   Socioeconomic History   Marital status: Married    Spouse name: Not on file   Number of children: Not on file   Years of education: Not on file   Highest education level: Not on file  Occupational History   Not on file  Tobacco Use   Smoking status: Never   Smokeless tobacco: Never  Vaping Use   Vaping Use: Never used  Substance and Sexual Activity   Alcohol use: No   Drug use: No   Sexual activity: Not on file  Other Topics Concern   Not on file  Social History Narrative   Not on file   Social Determinants of Health    Financial Resource Strain: Not on file  Food Insecurity: Not on file  Transportation Needs: Not on file  Physical Activity: Not on file  Stress: Not on file  Social Connections: Not on file  Intimate Partner Violence: Not on file    Review of Systems: See HPI, otherwise negative ROS  Physical Exam: BP (!) 134/92   Pulse 87   Temp 97.9 F (36.6 C) (Oral)   Resp 13   Ht 5\' 9"  (1.753 m)   Wt 104.3 kg   SpO2 97%   BMI 33.97 kg/m  General:   Alert,  Well-developed, well-nourished, pleasant and cooperative in NAD Neck:  Supple; no masses or thyromegaly. No significant cervical adenopathy. Lungs:  Clear throughout to auscultation.   No wheezes, crackles, or rhonchi. No acute distress. Heart:  Regular rate and rhythm; no murmurs, clicks, rubs,  or gallops. Abdomen: Non-distended, normal bowel sounds.  Soft and nontender without appreciable mass or hepatosplenomegaly.  Pulses:  Normal pulses noted. Extremities:  Without clubbing or edema.  Impression/Plan: 55 year old gentleman with a history of colonic adenoma.  Here for surveillance colonoscopy.  The risks, benefits, limitations, alternatives and imponderables have been reviewed with the patient. Questions have been answered. All parties are agreeable.       Notice: This  dictation was prepared with Dragon dictation along with smaller phrase technology. Any transcriptional errors that result from this process are unintentional and may not be corrected upon review.

## 2021-02-13 NOTE — Discharge Instructions (Addendum)
  Colonoscopy Discharge Instructions  Read the instructions outlined below and refer to this sheet in the next few weeks. These discharge instructions provide you with general information on caring for yourself after you leave the hospital. Your doctor may also give you specific instructions. While your treatment has been planned according to the most current medical practices available, unavoidable complications occasionally occur. If you have any problems or questions after discharge, call Dr. Gala Romney at [phone]. ACTIVITY You may resume your regular activity, but move at a slower pace for the next 24 hours.  Take frequent rest periods for the next 24 hours.  Walking will help get rid of the air and reduce the bloated feeling in your belly (abdomen).  No driving for 24 hours (because of the medicine (anesthesia) used during the test).   Do not sign any important legal documents or operate any machinery for 24 hours (because of the anesthesia used during the test).  NUTRITION Drink plenty of fluids.  You may resume your normal diet as instructed by your doctor.  Begin with a light meal and progress to your normal diet. Heavy or fried foods are harder to digest and may make you feel sick to your stomach (nauseated).  Avoid alcoholic beverages for 24 hours or as instructed.  MEDICATIONS You may resume your normal medications unless your doctor tells you otherwise.  WHAT YOU CAN EXPECT TODAY Some feelings of bloating in the abdomen.  Passage of more gas than usual.  Spotting of blood in your stool or on the toilet paper.  IF YOU HAD POLYPS REMOVED DURING THE COLONOSCOPY: No aspirin products for 7 days or as instructed.  No alcohol for 7 days or as instructed.  Eat a soft diet for the next 24 hours.  FINDING OUT THE RESULTS OF YOUR TEST Not all test results are available during your visit. If your test results are not back during the visit, make an appointment with your caregiver to find out the  results. Do not assume everything is normal if you have not heard from your caregiver or the medical facility. It is important for you to follow up on all of your test results.  SEEK IMMEDIATE MEDICAL ATTENTION IF: You have more than a spotting of blood in your stool.  Your belly is swollen (abdominal distention).  You are nauseated or vomiting.  You have a temperature over 101.  You have abdominal pain or discomfort that is severe or gets worse throughout the day.    No polyps found today!  It is recommended you return for repeat colonoscopy in 7 years  At patient request, I called Malissa at [phone] -call rolled to voicemail.  Left a message.

## 2021-02-18 ENCOUNTER — Encounter (HOSPITAL_COMMUNITY): Payer: Self-pay | Admitting: Internal Medicine

## 2021-03-13 ENCOUNTER — Other Ambulatory Visit: Payer: Self-pay | Admitting: Family Medicine

## 2021-04-01 ENCOUNTER — Ambulatory Visit: Payer: BC Managed Care – PPO

## 2021-04-01 ENCOUNTER — Other Ambulatory Visit: Payer: Self-pay

## 2021-04-01 DIAGNOSIS — R0683 Snoring: Secondary | ICD-10-CM

## 2021-04-06 ENCOUNTER — Other Ambulatory Visit: Payer: Self-pay | Admitting: Family Medicine

## 2021-04-08 ENCOUNTER — Other Ambulatory Visit: Payer: Self-pay

## 2021-04-08 ENCOUNTER — Ambulatory Visit (INDEPENDENT_AMBULATORY_CARE_PROVIDER_SITE_OTHER): Payer: BC Managed Care – PPO

## 2021-04-08 DIAGNOSIS — G4733 Obstructive sleep apnea (adult) (pediatric): Secondary | ICD-10-CM | POA: Diagnosis not present

## 2021-04-10 ENCOUNTER — Telehealth: Payer: Self-pay | Admitting: Pulmonary Disease

## 2021-04-10 DIAGNOSIS — G4733 Obstructive sleep apnea (adult) (pediatric): Secondary | ICD-10-CM | POA: Diagnosis not present

## 2021-04-10 NOTE — Telephone Encounter (Signed)
HST showed mild  OSA with AHI 9/ hr  Events were mostly noted when sleeping on his back  Suggest that he try to avoid sleeping on his back & increase his sleep time t at least 6h every night  Re-assess in 6 months with APP If remains symptomatic, can trial CPAP

## 2021-04-16 NOTE — Telephone Encounter (Signed)
Spoke with patient regarding HST results. They verbalized understanding. No further questions. Recall placed for 6 months. Nothing further needed at this time.

## 2021-04-22 ENCOUNTER — Other Ambulatory Visit: Payer: Self-pay | Admitting: Family Medicine

## 2021-04-22 ENCOUNTER — Telehealth: Payer: Self-pay | Admitting: Family Medicine

## 2021-04-22 NOTE — Telephone Encounter (Signed)
Pt called in regard to   phenytoin (DILANTIN) 200 MG ER capsule   States that med is on back order and is hard to get. Wants to know if needs to switch back to previous med  since currents is unavailable  Pt has one pill left    Pt. Spouse would like a call back in regard

## 2021-04-22 NOTE — Progress Notes (Signed)
Trying to find substitue

## 2021-04-23 ENCOUNTER — Other Ambulatory Visit: Payer: Self-pay | Admitting: Family Medicine

## 2021-04-23 MED ORDER — PHENYTOIN SODIUM EXTENDED 100 MG PO CAPS: ORAL_CAPSULE | ORAL | 5 refills | Status: DC

## 2021-04-23 NOTE — Telephone Encounter (Signed)
They do have some 100mg  in stock so a new rx can be sent for that strength

## 2021-04-23 NOTE — Telephone Encounter (Signed)
Patient returning call. Call back # [phone].

## 2021-04-23 NOTE — Telephone Encounter (Signed)
Called number in patients chart and it just rang and no voicemail has been set up

## 2021-04-23 NOTE — Telephone Encounter (Signed)
Also called wifes number in the chart and it rang and unable to leave voicemail

## 2021-04-23 NOTE — Progress Notes (Signed)
Changed rx due ot availability

## 2021-04-23 NOTE — Telephone Encounter (Signed)
Patient aware of dose change

## 2021-05-08 ENCOUNTER — Ambulatory Visit: Payer: BC Managed Care – PPO | Admitting: Family Medicine

## 2021-05-08 ENCOUNTER — Encounter: Payer: Self-pay | Admitting: Family Medicine

## 2021-05-08 ENCOUNTER — Other Ambulatory Visit: Payer: Self-pay

## 2021-05-08 VITALS — BP 133/76 | HR 83 | Resp 16 | Ht 69.0 in | Wt 236.0 lb

## 2021-05-08 DIAGNOSIS — E559 Vitamin D deficiency, unspecified: Secondary | ICD-10-CM

## 2021-05-08 DIAGNOSIS — E8881 Metabolic syndrome: Secondary | ICD-10-CM

## 2021-05-08 DIAGNOSIS — R569 Unspecified convulsions: Secondary | ICD-10-CM

## 2021-05-08 DIAGNOSIS — I10 Essential (primary) hypertension: Secondary | ICD-10-CM | POA: Diagnosis not present

## 2021-05-08 DIAGNOSIS — Z23 Encounter for immunization: Secondary | ICD-10-CM | POA: Diagnosis not present

## 2021-05-08 DIAGNOSIS — E785 Hyperlipidemia, unspecified: Secondary | ICD-10-CM

## 2021-05-08 DIAGNOSIS — R7302 Impaired glucose tolerance (oral): Secondary | ICD-10-CM

## 2021-05-08 NOTE — Patient Instructions (Addendum)
Annual exam in 6 months, call if you need me sooner ? ?TdAP today ? ?Fasting CBC, lipid, cmp and EGFR, PSA, TSH, Vit D and Dilantin level in am ? ?Please increase vegetables, reduce sweets and starchy foods, and stop eating after dinner till the next day ? ?Weight loss  goal of 10 to 12 pounds! ? ?It is important that you exercise regularly at least 30 minutes 5 times a week. If you develop chest pain, have severe difficulty breathing, or feel very tired, stop exercising immediately and seek medical attention  ? ?Thanks for choosing [facility], we consider it a privelige to serve you. ? ?

## 2021-05-10 ENCOUNTER — Other Ambulatory Visit: Payer: Self-pay | Admitting: Family Medicine

## 2021-05-10 MED ORDER — ERGOCALCIFEROL 1.25 MG (50000 UT) PO CAPS: 50000.0000 [IU] | ORAL_CAPSULE | ORAL | 2 refills | Status: DC

## 2021-05-11 LAB — CMP14+EGFR
ALT: 29 IU/L (ref 0–44)
AST: 21 IU/L (ref 0–40)
Albumin/Globulin Ratio: 1.4 (ref 1.2–2.2)
Albumin: 4.3 g/dL (ref 3.8–4.9)
Alkaline Phosphatase: 100 IU/L (ref 44–121)
BUN/Creatinine Ratio: 14 (ref 9–20)
BUN: 11 mg/dL (ref 6–24)
Bilirubin Total: 0.3 mg/dL (ref 0.0–1.2)
CO2: 27 mmol/L (ref 20–29)
Calcium: 9.4 mg/dL (ref 8.7–10.2)
Chloride: 100 mmol/L (ref 96–106)
Creatinine, Ser: 0.78 mg/dL (ref 0.76–1.27)
Globulin, Total: 3.1 g/dL (ref 1.5–4.5)
Glucose: 103 mg/dL — ABNORMAL HIGH (ref 70–99)
Potassium: 3.6 mmol/L (ref 3.5–5.2)
Sodium: 138 mmol/L (ref 134–144)
Total Protein: 7.4 g/dL (ref 6.0–8.5)
eGFR: 105 mL/min/{1.73_m2} (ref >59)

## 2021-05-11 LAB — CBC
Hematocrit: 44.1 % (ref 37.5–51.0)
Hemoglobin: 15.3 g/dL (ref 13.0–17.7)
MCH: 29.3 pg (ref 26.6–33.0)
MCHC: 34.7 g/dL (ref 31.5–35.7)
MCV: 85 fL (ref 79–97)
Platelets: 140 10*3/uL — ABNORMAL LOW (ref 150–450)
RBC: 5.22 x10E6/uL (ref 4.14–5.80)
RDW: 12.2 % (ref 11.6–15.4)
WBC: 5.4 10*3/uL (ref 3.4–10.8)

## 2021-05-11 LAB — TSH: TSH: 2.54 u[IU]/mL (ref 0.450–4.500)

## 2021-05-11 LAB — LIPID PANEL
Chol/HDL Ratio: 2.7 ratio (ref 0.0–5.0)
Cholesterol, Total: 175 mg/dL (ref 100–199)
HDL: 66 mg/dL (ref >39)
LDL Chol Calc (NIH): 89 mg/dL (ref 0–99)
Triglycerides: 112 mg/dL (ref 0–149)
VLDL Cholesterol Cal: 20 mg/dL (ref 5–40)

## 2021-05-11 LAB — PSA: Prostate Specific Ag, Serum: 1.2 ng/mL (ref 0.0–4.0)

## 2021-05-11 LAB — PHENYTOIN LEVEL, TOTAL: Phenytoin (Dilantin), Serum: 14.4 ug/mL (ref 10.0–20.0)

## 2021-05-11 LAB — VITAMIN D 25 HYDROXY (VIT D DEFICIENCY, FRACTURES): Vit D, 25-Hydroxy: 18.6 ng/mL — ABNORMAL LOW (ref 30.0–100.0)

## 2021-05-13 ENCOUNTER — Encounter: Payer: Self-pay | Admitting: Family Medicine

## 2021-05-13 NOTE — Progress Notes (Signed)
? ?Reginald Gonzales.     MRN: [id number]      DOB: 1965/12/28 ? ? ?HPI ?[name] is here for follow up and re-evaluation of chronic medical conditions, medication management and review of any available recent lab and radiology data.  ?Preventive health is updated, specifically  Cancer screening and Immunization.   ?Questions or concerns regarding consultations or procedures which the PT has had in the interim are  addressed. ?The PT denies any adverse reactions to current medications since the last visit.  ?There are no new concerns.  ?There are no specific complaints  ? ?ROS ?Denies recent fever or chills. ?Denies sinus pressure, nasal congestion, ear pain or sore throat. ?Denies chest congestion, productive cough or wheezing. ?Denies chest pains, palpitations and leg swelling ?Denies abdominal pain, nausea, vomiting,diarrhea or constipation.   ?Denies dysuria, frequency, hesitancy or incontinence. ?Denies joint pain, swelling and limitation in mobility. ?Denies headaches, seizures, numbness, or tingling. ?Denies depression, anxiety or insomnia. ?Denies skin break down or rash. ? ? ?PE ? ?BP 133/76   Pulse 83   Resp 16   Ht '5\' 9"'$  (1.753 m)   Wt 236 lb (107 kg)   SpO2 96%   BMI 34.85 kg/m?  ? ?Patient alert and oriented and in no cardiopulmonary distress. ? ?HEENT: No facial asymmetry, EOMI,     Neck supple . ? ?Chest: Clear to auscultation bilaterally. ? ?CVS: S1, S2 no murmurs, no S3.Regular rate. ? ?ABD: Soft non tender.  ? ?Ext: No edema ? ?MS: Adequate ROM spine, shoulders, hips and knees. ? ?Skin: Intact, no ulcerations or rash noted. ? ?Psych: Good eye contact, normal affect. Memory intact not anxious or depressed appearing. ? ?CNS: CN 2-12 intact, power,  normal throughout.no focal deficits noted. ? ? ?Assessment & Plan ? ?ESSENTIAL HYPERTENSION, BENIGN ?Controlled, no change in medication ?DASH diet and commitment to daily physical activity for a minimum of 30 minutes discussed and encouraged,  as a part of hypertension management. ?The importance of attaining a healthy weight is also discussed. ? ?BP/Weight 05/08/2021 02/13/2021 01/14/2021 01/10/2021 11/08/2020 06/25/2020 02/14/2020  ?Systolic BP 151 761 607 - 124 124 136  ?Diastolic BP 76 89 78 - 78 84 85  ?Wt. (Lbs) 236 230 231.12 231.6 230 233 235.12  ?BMI 34.85 33.97 34.13 34.2 33.97 34.41 34.72  ? ? ? ? ? ?IGT (impaired glucose tolerance) ?Updated lab needed at/ before next visit. ? ?Patient educated about the importance of limiting  Carbohydrate intake , the need to commit to daily physical activity for a minimum of 30 minutes , and to commit weight loss. ?The fact that changes in all these areas will reduce or eliminate all together the development of diabetes is stressed.  ? ?Diabetic Labs Latest Ref Rng & Units 05/09/2021 10/31/2020 09/09/2019 02/08/2019 07/30/2018  ?HbA1c <5.7 % of total Hgb - - - 6.0(H) -  ?Chol 100 - 199 mg/dL 175 142 240(H) - 196  ?HDL >39 mg/dL 66 58 58 - 55  ?Calc LDL 0 - 99 mg/dL 89 70 157(H) - 114(H)  ?Triglycerides 0 - 149 mg/dL 112 69 123 - 158(H)  ?Creatinine 0.76 - 1.27 mg/dL 0.78 0.78 0.73 0.86 0.91  ? ?BP/Weight 05/08/2021 02/13/2021 01/14/2021 01/10/2021 11/08/2020 06/25/2020 02/14/2020  ?Systolic BP 371 062 694 - 124 124 136  ?Diastolic BP 76 89 78 - 78 84 85  ?Wt. (Lbs) 236 230 231.12 231.6 230 233 235.12  ?BMI 34.85 33.97 34.13 34.2 33.97 34.41 34.72  ? ?No  flowsheet data found. ? ? ? ?Morbid obesity ([place]) ? ?Patient re-educated about  the importance of commitment to a  minimum of 150 minutes of exercise per week as able. ? ?The importance of healthy food choices with portion control discussed, as well as eating regularly and within a 12 hour window most days. ?The need to choose "clean , green" food 50 to 75% of the time is discussed, as well as to make water the primary drink and set a goal of 64 ounces water daily. ? ?  ?Weight /BMI 05/08/2021 02/13/2021 01/14/2021  ?WEIGHT 236 lb 230 lb 231 lb 1.9 oz  ?HEIGHT '5\' 9"'$  '5\' 9"'$  '5\' 9"'$   ?BMI  34.85 kg/m2 33.97 kg/m2 34.13 kg/m2  ? ? ? ? ?Metabolic syndrome X ?The increased risk of cardiovascular disease associated with this diagnosis, and the need to consistently work on lifestyle to change this is discussed. ?Following  a  heart healthy diet ,commitment to 30 minutes of exercise at least 5 days per week, as well as control of blood sugar and cholesterol , and achieving a healthy weight are all the areas to be addressed . ? ? ?Hyperlipidemia LDL goal <100 ?Hyperlipidemia:Low fat diet discussed and encouraged. ? ? ?Lipid Panel  ?Lab Results  ?Component Value Date  ? CHOL 175 05/09/2021  ? HDL 66 05/09/2021  ? [place] 89 05/09/2021  ? TRIG 112 05/09/2021  ? CHOLHDL 2.7 05/09/2021  ? ?Controlled, no change in medication ? ? ? ? ?Convulsions ?Controlled on current meds, no change ? ?

## 2021-05-13 NOTE — Assessment & Plan Note (Signed)
Controlled on current meds, no change ?

## 2021-05-13 NOTE — Assessment & Plan Note (Signed)
Updated lab needed at/ before next visit. ? ?Patient educated about the importance of limiting  Carbohydrate intake , the need to commit to daily physical activity for a minimum of 30 minutes , and to commit weight loss. ?The fact that changes in all these areas will reduce or eliminate all together the development of diabetes is stressed.  ? ?Diabetic Labs Latest Ref Rng & Units 05/09/2021 10/31/2020 09/09/2019 02/08/2019 07/30/2018  ?HbA1c <5.7 % of total Hgb - - - 6.0(H) -  ?Chol 100 - 199 mg/dL 175 142 240(H) - 196  ?HDL >39 mg/dL 66 58 58 - 55  ?Calc LDL 0 - 99 mg/dL 89 70 157(H) - 114(H)  ?Triglycerides 0 - 149 mg/dL 112 69 123 - 158(H)  ?Creatinine 0.76 - 1.27 mg/dL 0.78 0.78 0.73 0.86 0.91  ? ?BP/Weight 05/08/2021 02/13/2021 01/14/2021 01/10/2021 11/08/2020 06/25/2020 02/14/2020  ?Systolic BP 810 175 102 - 124 124 136  ?Diastolic BP 76 89 78 - 78 84 85  ?Wt. (Lbs) 236 230 231.12 231.6 230 233 235.12  ?BMI 34.85 33.97 34.13 34.2 33.97 34.41 34.72  ? ?No flowsheet data found. ? ? ?

## 2021-05-13 NOTE — Assessment & Plan Note (Signed)
Controlled, no change in medication ?DASH diet and commitment to daily physical activity for a minimum of 30 minutes discussed and encouraged, as a part of hypertension management. ?The importance of attaining a healthy weight is also discussed. ? ?BP/Weight 05/08/2021 02/13/2021 01/14/2021 01/10/2021 11/08/2020 06/25/2020 02/14/2020  ?Systolic BP 239 532 023 - 124 124 136  ?Diastolic BP 76 89 78 - 78 84 85  ?Wt. (Lbs) 236 230 231.12 231.6 230 233 235.12  ?BMI 34.85 33.97 34.13 34.2 33.97 34.41 34.72  ? ? ? ? ?

## 2021-05-13 NOTE — Assessment & Plan Note (Signed)
The increased risk of cardiovascular disease associated with this diagnosis, and the need to consistently work on lifestyle to change this is discussed. Following  a  heart healthy diet ,commitment to 30 minutes of exercise at least 5 days per week, as well as control of blood sugar and cholesterol , and achieving a healthy weight are all the areas to be addressed .  

## 2021-05-13 NOTE — Assessment & Plan Note (Signed)
?  Patient re-educated about  the importance of commitment to a  minimum of 150 minutes of exercise per week as able. ? ?The importance of healthy food choices with portion control discussed, as well as eating regularly and within a 12 hour window most days. ?The need to choose "clean , green" food 50 to 75% of the time is discussed, as well as to make water the primary drink and set a goal of 64 ounces water daily. ? ?  ?Weight /BMI 05/08/2021 02/13/2021 01/14/2021  ?WEIGHT 236 lb 230 lb 231 lb 1.9 oz  ?HEIGHT '5\' 9"'$  '5\' 9"'$  '5\' 9"'$   ?BMI 34.85 kg/m2 33.97 kg/m2 34.13 kg/m2  ? ? ? ?

## 2021-05-13 NOTE — Assessment & Plan Note (Signed)
Hyperlipidemia:Low fat diet discussed and encouraged. ? ? ?Lipid Panel  ?Lab Results  ?Component Value Date  ? CHOL 175 05/09/2021  ? HDL 66 05/09/2021  ? [place] 89 05/09/2021  ? TRIG 112 05/09/2021  ? CHOLHDL 2.7 05/09/2021  ? ?Controlled, no change in medication ? ? ? ?

## 2021-09-05 ENCOUNTER — Other Ambulatory Visit: Payer: Self-pay | Admitting: Family Medicine

## 2021-10-25 ENCOUNTER — Other Ambulatory Visit: Payer: Self-pay | Admitting: Family Medicine

## 2021-11-08 ENCOUNTER — Encounter: Payer: BC Managed Care – PPO | Admitting: Family Medicine

## 2021-11-21 ENCOUNTER — Encounter: Payer: Self-pay | Admitting: Family Medicine

## 2021-11-21 ENCOUNTER — Ambulatory Visit (INDEPENDENT_AMBULATORY_CARE_PROVIDER_SITE_OTHER): Payer: BC Managed Care – PPO | Admitting: Family Medicine

## 2021-11-21 VITALS — BP 142/90 | HR 85 | Ht 69.0 in | Wt 232.0 lb

## 2021-11-21 DIAGNOSIS — R569 Unspecified convulsions: Secondary | ICD-10-CM

## 2021-11-21 DIAGNOSIS — Z0001 Encounter for general adult medical examination with abnormal findings: Secondary | ICD-10-CM

## 2021-11-21 DIAGNOSIS — M722 Plantar fascial fibromatosis: Secondary | ICD-10-CM | POA: Diagnosis not present

## 2021-11-21 DIAGNOSIS — Z0000 Encounter for general adult medical examination without abnormal findings: Secondary | ICD-10-CM

## 2021-11-21 DIAGNOSIS — E559 Vitamin D deficiency, unspecified: Secondary | ICD-10-CM | POA: Diagnosis not present

## 2021-11-21 DIAGNOSIS — I10 Essential (primary) hypertension: Secondary | ICD-10-CM

## 2021-11-21 DIAGNOSIS — Z23 Encounter for immunization: Secondary | ICD-10-CM | POA: Diagnosis not present

## 2021-11-21 DIAGNOSIS — R7303 Prediabetes: Secondary | ICD-10-CM

## 2021-11-21 MED ORDER — PREDNISONE 5 MG PO TABS: 5.0000 mg | ORAL_TABLET | Freq: Two times a day (BID) | ORAL | 0 refills | Status: AC

## 2021-11-21 MED ORDER — OLMESARTAN MEDOXOMIL-HCTZ 20-12.5 MG PO TABS: 1.0000 | ORAL_TABLET | Freq: Every day | ORAL | 2 refills | Status: DC

## 2021-11-21 NOTE — Patient Instructions (Addendum)
F/U in 7 weeks , re evaluate blood pressure, call if you need me sooner  Flu vaccine today  Labs today, HBA1C, cmp and EgFR,  and dilantin level and vit FD  Condolence and prayers as you grieve  Tylenol one daily as needed for arthritic pain is fine  Short course of prednisone is prescribed for left foot pain from planar fascitis, use ice  massages every night to to foot also  Nurse pls provide information re [facility] grief sessions  STOP hCTZ, nurse pls contact pharmacy also and let them know this med is  discontinued  New for BP which is too high is olmesartan/ hctz 20/12.5 one daily, start tomorrow  Need covid vaccine when available;le  Continue healthy  food choice and regular exercise  Thanks for choosing [facility], we consider it a privelige to serve you.  

## 2021-11-23 LAB — CMP14+EGFR
ALT: 22 IU/L (ref 0–44)
AST: 21 IU/L (ref 0–40)
Albumin/Globulin Ratio: 1.4 (ref 1.2–2.2)
Albumin: 4.7 g/dL (ref 3.8–4.9)
Alkaline Phosphatase: 110 IU/L (ref 44–121)
BUN/Creatinine Ratio: 13 (ref 9–20)
BUN: 11 mg/dL (ref 6–24)
Bilirubin Total: 0.4 mg/dL (ref 0.0–1.2)
CO2: 26 mmol/L (ref 20–29)
Calcium: 10.1 mg/dL (ref 8.7–10.2)
Chloride: 99 mmol/L (ref 96–106)
Creatinine, Ser: 0.85 mg/dL (ref 0.76–1.27)
Globulin, Total: 3.3 g/dL (ref 1.5–4.5)
Glucose: 81 mg/dL (ref 70–99)
Potassium: 4.2 mmol/L (ref 3.5–5.2)
Sodium: 143 mmol/L (ref 134–144)
Total Protein: 8 g/dL (ref 6.0–8.5)
eGFR: 103 mL/min/{1.73_m2} (ref >59)

## 2021-11-23 LAB — PHENYTOIN LEVEL, TOTAL: Phenytoin (Dilantin), Serum: 12.3 ug/mL (ref 10.0–20.0)

## 2021-11-23 LAB — HEMOGLOBIN A1C
Est. average glucose Bld gHb Est-mCnc: 131 mg/dL
Hgb A1c MFr Bld: 6.2 % — ABNORMAL HIGH (ref 4.8–5.6)

## 2021-11-23 LAB — VITAMIN D 25 HYDROXY (VIT D DEFICIENCY, FRACTURES): Vit D, 25-Hydroxy: 67.7 ng/mL (ref 30.0–100.0)

## 2021-11-24 ENCOUNTER — Encounter: Payer: Self-pay | Admitting: Family Medicine

## 2021-11-24 DIAGNOSIS — M722 Plantar fascial fibromatosis: Secondary | ICD-10-CM | POA: Insufficient documentation

## 2021-11-24 NOTE — Assessment & Plan Note (Signed)

## 2021-11-24 NOTE — Assessment & Plan Note (Signed)
Uncontrolled, change to benicar /hctz DASH diet and commitment to daily physical activity for a minimum of 30 minutes discussed and encouraged, as a part of hypertension management. The importance of attaining a healthy weight is also discussed.     11/21/2021   10:05 AM 11/21/2021   10:04 AM 05/08/2021    2:09 PM 02/13/2021    1:20 PM 02/13/2021    1:05 PM 02/13/2021    1:00 PM 02/13/2021   12:55 PM  BP/Weight  Systolic BP 071 252 479 980 012 393 594  Diastolic BP 90 90 76 89 89 93 90  Wt. (Lbs)  232.04 236      BMI  34.27 kg/m2 34.85 kg/m2

## 2021-11-24 NOTE — Assessment & Plan Note (Signed)
  Patient re-educated about  the importance of commitment to a  minimum of 150 minutes of exercise per week as able.  The importance of healthy food choices with portion control discussed, as well as eating regularly and within a 12 hour window most days. The need to choose "clean , green" food 50 to 75% of the time is discussed, as well as to make water the primary drink and set a goal of 64 ounces water daily.       11/21/2021   10:04 AM 05/08/2021    2:09 PM 02/13/2021   12:14 PM  Weight /BMI  Weight 232 lb 0.6 oz 236 lb 230 lb  Height '5\' 9"'$  (1.753 m) '5\' 9"'$  (1.753 m) '5\' 9"'$  (1.753 m)  BMI 34.27 kg/m2 34.85 kg/m2 33.97 kg/m2

## 2021-11-24 NOTE — Progress Notes (Signed)
Reginald Gonzales.     MRN: [id number]      DOB: 07-14-1965   HPI: Patient is in for annual physical exam. Uncontrolled blood pressure is addressed Left foot pain is also addressed. Currently grieving the loss of his wife of 27 years in the past 4 monts Recent labs, if available are reviewed. Immunization is reviewed , and  updated if needed.    PE; BP (!) 142/90 (BP Location: Right Arm, Patient Position: Sitting, Cuff Size: Large)   Pulse 85   Ht '5\' 9"'$  (1.753 m)   Wt 232 lb 0.6 oz (105.3 kg)   SpO2 96%   BMI 34.27 kg/m   Pleasant male, alert and oriented x 3, in no cardio-pulmonary distress. Afebrile. HEENT No facial trauma or asymetry. Sinuses non tender. EOMI External ears normal,  Neck: supple, no adenopathy,JVD or thyromegaly.No bruits.  Chest: Clear to ascultation bilaterally.No crackles or wheezes. Non tender to palpation  Cardiovascular system; Heart sounds normal,  S1 and  S2 ,no S3.  No murmur, or thrill. Apical beat not displaced Peripheral pulses normal.  Abdomen: Soft, non tender, no organomegaly or masses. No bruits. Bowel sounds normal. No guarding, tenderness or rebound.    Musculoskeletal exam: Full ROM of spine, hips , shoulders and knees. No deformity ,swelling or crepitus noted. No muscle wasting or atrophy.  Tender under left foot to palpation on instep  Neurologic: Cranial nerves 2 to 12 intact. Power, tone ,sensation and reflexes normal throughout. No disturbance in gait. No tremor.  Skin: Intact, no ulceration, erythema , scaling or rash noted. Pigmentation normal throughout  Psych; Normal mood and affect. Judgement and concentration normal   Assessment & Plan:  Annual physical exam Annual exam as documented. Counseling done  re healthy lifestyle involving commitment to 150 minutes exercise per week, heart healthy diet, and attaining healthy weight.The importance of adequate sleep also discussed. Regular seat belt use  and home safety, is also discussed. Changes in health habits are decided on by the patient with goals and time frames  set for achieving them. Immunization and cancer screening needs are specifically addressed at this visit.   ESSENTIAL HYPERTENSION, BENIGN Uncontrolled, change to benicar /hctz DASH diet and commitment to daily physical activity for a minimum of 30 minutes discussed and encouraged, as a part of hypertension management. The importance of attaining a healthy weight is also discussed.     11/21/2021   10:05 AM 11/21/2021   10:04 AM 05/08/2021    2:09 PM 02/13/2021    1:20 PM 02/13/2021    1:05 PM 02/13/2021    1:00 PM 02/13/2021   12:55 PM  BP/Weight  Systolic BP 628 366 294 765 465 035 465  Diastolic BP 90 90 76 89 89 93 90  Wt. (Lbs)  232.04 236      BMI  34.27 kg/m2 34.85 kg/m2           Plantar fasciitis, left Short course prednisone and ice  massage  Morbid obesity ([place])  Patient re-educated about  the importance of commitment to a  minimum of 150 minutes of exercise per week as able.  The importance of healthy food choices with portion control discussed, as well as eating regularly and within a 12 hour window most days. The need to choose "clean , green" food 50 to 75% of the time is discussed, as well as to make water the primary drink and set a goal of 64 ounces water daily.  11/21/2021   10:04 AM 05/08/2021    2:09 PM 02/13/2021   12:14 PM  Weight /BMI  Weight 232 lb 0.6 oz 236 lb 230 lb  Height '5\' 9"'$  (1.753 m) '5\' 9"'$  (1.753 m) '5\' 9"'$  (1.753 m)  BMI 34.27 kg/m2 34.85 kg/m2 33.97 kg/m2

## 2021-11-24 NOTE — Assessment & Plan Note (Addendum)
Short course prednisone and ice  massage

## 2021-11-29 ENCOUNTER — Other Ambulatory Visit: Payer: Self-pay | Admitting: Nurse Practitioner

## 2021-11-29 ENCOUNTER — Telehealth: Payer: Self-pay | Admitting: Family Medicine

## 2021-11-29 DIAGNOSIS — M722 Plantar fascial fibromatosis: Secondary | ICD-10-CM

## 2021-11-29 MED ORDER — IBUPROFEN 600 MG PO TABS: 600.0000 mg | ORAL_TABLET | Freq: Two times a day (BID) | ORAL | 0 refills | Status: DC | PRN

## 2021-11-29 NOTE — Telephone Encounter (Signed)
Patient needs predniSONE Sent in to pharm from visit on 9/14

## 2021-11-29 NOTE — Telephone Encounter (Signed)
Per the 9/14 discharge she said she was prescribing a short course of prednisone but nothing was ever sent in. Please advise

## 2021-11-29 NOTE — Telephone Encounter (Signed)
Pt aware.

## 2021-11-29 NOTE — Telephone Encounter (Signed)
States he took the 1st course of the steroids but the pain started coming back again when he finished the prednisone and wants to keep it at [place]. Wants a few more sent in or another course if he can take it. Pls advise

## 2022-01-15 ENCOUNTER — Ambulatory Visit: Payer: BC Managed Care – PPO | Admitting: Family Medicine

## 2022-01-15 ENCOUNTER — Encounter: Payer: Self-pay | Admitting: Family Medicine

## 2022-01-15 VITALS — BP 142/90 | HR 80 | Ht 69.0 in | Wt 234.0 lb

## 2022-01-15 DIAGNOSIS — F322 Major depressive disorder, single episode, severe without psychotic features: Secondary | ICD-10-CM | POA: Insufficient documentation

## 2022-01-15 DIAGNOSIS — I10 Essential (primary) hypertension: Secondary | ICD-10-CM

## 2022-01-15 DIAGNOSIS — R7303 Prediabetes: Secondary | ICD-10-CM | POA: Diagnosis not present

## 2022-01-15 DIAGNOSIS — E785 Hyperlipidemia, unspecified: Secondary | ICD-10-CM

## 2022-01-15 MED ORDER — OLMESARTAN MEDOXOMIL-HCTZ 40-12.5 MG PO TABS: 1.0000 | ORAL_TABLET | Freq: Every day | ORAL | 3 refills | Status: DC

## 2022-01-15 NOTE — Assessment & Plan Note (Signed)
Uncontrolled , increase olmesartan/hctz dose DASH diet and commitment to daily physical activity for a minimum of 30 minutes discussed and encouraged, as a part of hypertension management. The importance of attaining a healthy weight is also discussed.     01/15/2022   10:05 AM 01/15/2022    9:51 AM 01/15/2022    9:46 AM 11/21/2021   10:05 AM 11/21/2021   10:04 AM 05/08/2021    2:09 PM 02/13/2021    1:20 PM  BP/Weight  Systolic BP 532 992 426 834 196 222 979  Diastolic BP 90 90 77 90 90 76 89  Wt. (Lbs)   234  232.04 236   BMI   34.56 kg/m2  34.27 kg/m2 34.85 kg/m2

## 2022-01-15 NOTE — Patient Instructions (Signed)
Follow-up in January reevaluate hypertension and depression, call if you need me sooner.  Blood pressure is still uncontrolled new higher dose of olmesartan HCTZ is 40/12.5 one  daily.  Please start this tomorrow.  Fasting lipid CMP and EGFR and HbA1c 5 days before your January visit.  It is important that you exercise regularly at least 30 minutes 5 times a week. If you develop chest pain, have severe difficulty breathing, or feel very tired, stop exercising immediately and seek medical attention   Remember that you are NEVER alone and that help is available for you   Thanks for choosing [facility], we consider it a privelige to serve you.

## 2022-01-15 NOTE — Assessment & Plan Note (Signed)
  Patient re-educated about  the importance of commitment to a  minimum of 150 minutes of exercise per week as able.  The importance of healthy food choices with portion control discussed, as well as eating regularly and within a 12 hour window most days. The need to choose "clean , green" food 50 to 75% of the time is discussed, as well as to make water the primary drink and set a goal of 64 ounces water daily.       01/15/2022    9:46 AM 11/21/2021   10:04 AM 05/08/2021    2:09 PM  Weight /BMI  Weight 234 lb 232 lb 0.6 oz 236 lb  Height '5\' 9"'$  (1.753 m) '5\' 9"'$  (1.753 m) '5\' 9"'$  (1.753 m)  BMI 34.56 kg/m2 34.27 kg/m2 34.85 kg/m2    deteriorated

## 2022-01-15 NOTE — Assessment & Plan Note (Signed)
Declines medication and / or therapy at this time, re eval in 4 weeks

## 2022-01-15 NOTE — Progress Notes (Signed)
Reginald Gonzales.     MRN: [id number]      DOB: November 23, 1965   HPI Reginald Gonzales is here for follow up and re-evaluation of chronic medical conditions, medication management and review of any available recent lab and radiology data.  Preventive health is updated, specifically  Cancer screening and Immunization.   verse reactions to current medications since the last visit.  PHQ 9 score positive for severe depression, not currently suicidal or homicidal, but he has been in the past. Still grieving loss of wife, but states prior to hr death he has had suicidal thoughts in the past ROS Denies recent fever or chills. Denies sinus pressure, nasal congestion, ear pain or sore throat. Denies chest congestion, productive cough or wheezing. Denies chest pains, palpitations and leg swelling Denies abdominal pain, nausea, vomiting,diarrhea or constipation.   Denies dysuria, frequency, hesitancy or incontinence. Denies joint pain, swelling and limitation in mobility. Denies headaches, seizures, numbness, or tingling. . Denies skin break down or rash.   PE  BP (!) 142/90   Pulse 80   Ht '5\' 9"'$  (1.753 m)   Wt 234 lb (106.1 kg)   SpO2 96%   BMI 34.56 kg/m   Patient alert and oriented and in no cardiopulmonary distress.  HEENT: No facial asymmetry, EOMI,     Neck supple .  Chest: Clear to auscultation bilaterally.  CVS: S1, S2 no murmurs, no S3.Regular rate.  ABD: Soft non tender.   Ext: No edema  MS: Adequate ROM spine, shoulders, hips and knees.  Skin: Intact, no ulcerations or rash noted.  Psych: Good eye contact, tearful at times and at times poor eye contact. Memory intact not anxious but  depressed appearing.  CNS: CN 2-12 intact, power,  normal throughout.no focal deficits noted.   Assessment & Plan  Essential hypertension, benign Uncontrolled , increase olmesartan/hctz dose DASH diet and commitment to daily physical activity for a minimum of 30 minutes discussed and  encouraged, as a part of hypertension management. The importance of attaining a healthy weight is also discussed.     01/15/2022   10:05 AM 01/15/2022    9:51 AM 01/15/2022    9:46 AM 11/21/2021   10:05 AM 11/21/2021   10:04 AM 05/08/2021    2:09 PM 02/13/2021    1:20 PM  BP/Weight  Systolic BP 947 096 283 662 947 654 650  Diastolic BP 90 90 77 90 90 76 89  Wt. (Lbs)   234  232.04 236   BMI   34.56 kg/m2  34.27 kg/m2 34.85 kg/m2        Morbid obesity ([facility])  Patient re-educated about  the importance of commitment to a  minimum of 150 minutes of exercise per week as able.  The importance of healthy food choices with portion control discussed, as well as eating regularly and within a 12 hour window most days. The need to choose "clean , green" food 50 to 75% of the time is discussed, as well as to make water the primary drink and set a goal of 64 ounces water daily.       01/15/2022    9:46 AM 11/21/2021   10:04 AM 05/08/2021    2:09 PM  Weight /BMI  Weight 234 lb 232 lb 0.6 oz 236 lb  Height '5\' 9"'$  (1.753 m) '5\' 9"'$  (1.753 m) '5\' 9"'$  (1.753 m)  BMI 34.56 kg/m2 34.27 kg/m2 34.85 kg/m2    deteriorated  Depression, major, single episode, severe ([place])  Declines medication and / or therapy at this time, re eval in 4 weeks

## 2022-02-11 ENCOUNTER — Other Ambulatory Visit: Payer: Self-pay

## 2022-02-11 ENCOUNTER — Telehealth: Payer: Self-pay | Admitting: Family Medicine

## 2022-02-11 MED ORDER — ERGOCALCIFEROL 1.25 MG (50000 UT) PO CAPS: 50000.0000 [IU] | ORAL_CAPSULE | ORAL | 2 refills | Status: DC

## 2022-02-11 NOTE — Telephone Encounter (Signed)
Refills sent

## 2022-02-11 NOTE — Telephone Encounter (Signed)
Prescription Request  02/11/2022  Is this a "Controlled Substance" medicine? No  LOV: 01/15/2022  What is the name of the medication or equipment? ergocalciferol (VITAMIN D2) 1.25 MG (50000 UT) capsule   Have you contacted your pharmacy to request a refill? Yes   Which pharmacy would you like this sent to?  [id number] [place] [facility] [address]Phone: [phone]: [phone] [facility] [address] [address]Phone: [phone]: [phone] [facility] [address] [address] [facility] [address]Phone: [phone]: [phone]   Patient notified that their request is being sent to the clinical staff for review and that they should receive a response within 2 business days.   Please advise at [phone]

## 2022-02-17 ENCOUNTER — Telehealth: Payer: Self-pay | Admitting: Family Medicine

## 2022-02-17 ENCOUNTER — Other Ambulatory Visit: Payer: Self-pay

## 2022-02-17 ENCOUNTER — Other Ambulatory Visit: Payer: Self-pay | Admitting: Family Medicine

## 2022-02-17 MED ORDER — OLMESARTAN MEDOXOMIL-HCTZ 40-12.5 MG PO TABS: 1.0000 | ORAL_TABLET | Freq: Every day | ORAL | 3 refills | Status: DC

## 2022-02-17 NOTE — Telephone Encounter (Signed)
Refills sent

## 2022-02-17 NOTE — Telephone Encounter (Signed)
Pt called stating phar told him he had to call us to get a refill on his bp med   Prescription Request  02/17/2022  Is this a "Controlled Substance" medicine? No  LOV: 01/15/2022  What is the name of the medication or equipment? olmesartan-hydrochlorothiazide (BENICAR HCT) 40-12.5 MG tablet   Have you contacted your pharmacy to request a refill? Yes   Which pharmacy would you like this sent to?  [id number] [place] - [address]Phone: [phone]: [phone] [facility] [address]Phone: [phone]: [phone] [facility] [address] [address] [facility] [address]Phone: [phone]: [phone]   Patient notified that their request is being sent to the clinical staff for review and that they should receive a response within 2 business days.   Please advise at [phone]

## 2022-03-14 ENCOUNTER — Other Ambulatory Visit: Payer: Self-pay | Admitting: Nurse Practitioner

## 2022-03-14 DIAGNOSIS — M722 Plantar fascial fibromatosis: Secondary | ICD-10-CM

## 2022-03-27 ENCOUNTER — Ambulatory Visit: Payer: BC Managed Care – PPO | Admitting: Family Medicine

## 2022-03-27 ENCOUNTER — Encounter: Payer: Self-pay | Admitting: Family Medicine

## 2022-03-27 VITALS — BP 128/82 | HR 84 | Ht 69.0 in | Wt 237.0 lb

## 2022-03-27 DIAGNOSIS — R569 Unspecified convulsions: Secondary | ICD-10-CM

## 2022-03-27 DIAGNOSIS — R7303 Prediabetes: Secondary | ICD-10-CM

## 2022-03-27 DIAGNOSIS — E785 Hyperlipidemia, unspecified: Secondary | ICD-10-CM

## 2022-03-27 DIAGNOSIS — I10 Essential (primary) hypertension: Secondary | ICD-10-CM | POA: Diagnosis not present

## 2022-03-27 DIAGNOSIS — E8881 Metabolic syndrome: Secondary | ICD-10-CM

## 2022-03-27 DIAGNOSIS — R7302 Impaired glucose tolerance (oral): Secondary | ICD-10-CM

## 2022-03-27 NOTE — Assessment & Plan Note (Signed)
Patient educated about the importance of limiting  Carbohydrate intake , the need to commit to daily physical activity for a minimum of 30 minutes , and to commit weight loss. The fact that changes in all these areas will reduce or eliminate all together the development of diabetes is stressed.      Latest Ref Rng & Units 11/21/2021   11:14 AM 05/09/2021    8:23 AM 10/31/2020   10:30 AM 09/09/2019    9:53 AM 02/08/2019    8:26 AM  Diabetic Labs  HbA1c 4.8 - 5.6 % 6.2     6.0   Chol 100 - 199 mg/dL  175  142  240    HDL >39 mg/dL  66  58  58    Calc LDL 0 - 99 mg/dL  89  70  157    Triglycerides 0 - 149 mg/dL  112  69  123    Creatinine 0.76 - 1.27 mg/dL 0.85  0.78  0.78  0.73  0.86       03/27/2022    4:48 PM 03/27/2022    4:33 PM 01/15/2022   10:05 AM 01/15/2022    9:51 AM 01/15/2022    9:46 AM 11/21/2021   10:05 AM 11/21/2021   10:04 AM  BP/Weight  Systolic BP 193 790 240 973 532 992 426  Diastolic BP 82 82 90 90 77 90 90  Wt. (Lbs)  237.04   234  232.04  BMI  35 kg/m2   34.56 kg/m2  34.27 kg/m2       No data to display          Updated lab needed .

## 2022-03-27 NOTE — Assessment & Plan Note (Signed)
Hyperlipidemia:Low fat diet discussed and encouraged.   Lipid Panel  Lab Results  Component Value Date   CHOL 175 05/09/2021   HDL 66 05/09/2021   LDLCALC 89 05/09/2021   TRIG 112 05/09/2021   CHOLHDL 2.7 05/09/2021     Updated lab needed .

## 2022-03-27 NOTE — Assessment & Plan Note (Signed)
Controlled, no change in medication DASH diet and commitment to daily physical activity for a minimum of 30 minutes discussed and encouraged, as a part of hypertension management. The importance of attaining a healthy weight is also discussed.     03/27/2022    4:48 PM 03/27/2022    4:33 PM 01/15/2022   10:05 AM 01/15/2022    9:51 AM 01/15/2022    9:46 AM 11/21/2021   10:05 AM 11/21/2021   10:04 AM  BP/Weight  Systolic BP 161 096 045 409 811 914 782  Diastolic BP 82 82 90 90 77 90 90  Wt. (Lbs)  237.04   234  232.04  BMI  35 kg/m2   34.56 kg/m2  34.27 kg/m2

## 2022-03-27 NOTE — Patient Instructions (Addendum)
F/U mid March, call if you need me sooner  No med changes  Fasting lipid,cmp and EGFr, HBA1C, PSA and CBC, March 4 or shortly after   It is important that you exercise regularly at least 30 minutes 5 times a week. If you develop chest pain, have severe difficulty breathing, or feel very tired, stop exercising immediately and seek medical attention  Think about what you will eat, plan ahead. Choose " clean, green, fresh or frozen" over canned, processed or packaged foods which are more sugary, salty and fatty. 70 to 75% of food eaten should be vegetables and fruit. Three meals at set times with snacks allowed between meals, but they must be fruit or vegetables. Aim to eat over a 12 hour period , example 7 am to 7 pm, and STOP after  your last meal of the day. Drink water,generally about 64 ounces per day, no other drink is as healthy. Fruit juice is best enjoyed in a healthy way, by EATING the fruit.  Thanks for choosing [facility], we consider it a privelige to serve you.

## 2022-03-27 NOTE — Assessment & Plan Note (Signed)
The increased risk of cardiovascular disease associated with this diagnosis, and the need to consistently work on lifestyle to change this is discussed. Following  a  heart healthy diet ,commitment to 30 minutes of exercise at least 5 days per week, as well as control of blood sugar and cholesterol , and achieving a healthy weight are all the areas to be addressed .  

## 2022-03-27 NOTE — Assessment & Plan Note (Addendum)
Patient educated      No data to display

## 2022-03-27 NOTE — Assessment & Plan Note (Signed)
  Patient re-educated about  the importance of commitment to a  minimum of 150 minutes of exercise per week as able.  The importance of healthy food choices with portion control discussed, as well as eating regularly and within a 12 hour window most days. The need to choose "clean , green" food 50 to 75% of the time is discussed, as well as to make water the primary drink and set a goal of 64 ounces water daily.       03/27/2022    4:33 PM 01/15/2022    9:46 AM 11/21/2021   10:04 AM  Weight /BMI  Weight 237 lb 0.6 oz 234 lb 232 lb 0.6 oz  Height '5\' 9"'$  (1.753 m) '5\' 9"'$  (1.753 m) '5\' 9"'$  (1.753 m)  BMI 35 kg/m2 34.56 kg/m2 34.27 kg/m2    Deteriorated

## 2022-03-27 NOTE — Assessment & Plan Note (Signed)
Controlled, no change in medication  

## 2022-03-27 NOTE — Progress Notes (Signed)
Reginald Gonzales Reginald Gonzales.     MRN: [id number]      DOB: [birth year]   HPI Mr. Spraggins is here for follow up and re-evaluation of chronic medical conditions, medication management and review of any available recent lab and radiology data.  Preventive health is updated, specifically  Cancer screening and Immunization.   Questions or concerns regarding consultations or procedures which the PT has had in the interim are  addressed. The PT denies any adverse reactions to current medications since the last visit.  There are no new concerns.  There are no specific complaints   ROS Denies recent fever or chills. Denies sinus pressure, nasal congestion, ear pain or sore throat. Denies chest congestion, productive cough or wheezing. Denies chest pains, palpitations and leg swelling Denies abdominal pain, nausea, vomiting,diarrhea or constipation.   Denies dysuria, frequency, hesitancy or incontinence. Denies joint pain, swelling and limitation in mobility. Denies headaches, seizures, numbness, or tingling. Denies depression, anxiety or insomnia. Denies skin break down or rash.   PE  BP 128/82   Pulse 84   Ht '5\' 9"'$  (1.753 m)   Wt 237 lb 0.6 oz (107.5 kg)   SpO2 94%   BMI 35.00 kg/m   Patient alert and oriented and in no cardiopulmonary distress.  HEENT: No facial asymmetry, EOMI,     Neck supple .  Chest: Clear to auscultation bilaterally.  CVS: S1, S2 no murmurs, no S3.Regular rate.  ABD: Soft non tender.   Ext: No edema  MS: Adequate ROM spine, shoulders, hips and knees.  Skin: Intact, no ulcerations or rash noted.  Psych: Good eye contact, normal affect. Memory intact not anxious or depressed appearing.  CNS: CN 2-12 intact, power,  normal throughout.no focal deficits noted.   Assessment & Plan  Essential hypertension, benign Controlled, no change in medication DASH diet and commitment to daily physical activity for a minimum of 30 minutes discussed and encouraged, as  a part of hypertension management. The importance of attaining a healthy weight is also discussed.     03/27/2022    4:48 PM 03/27/2022    4:33 PM 01/15/2022   10:05 AM 01/15/2022    9:51 AM 01/15/2022    9:46 AM 11/21/2021   10:05 AM 11/21/2021   10:04 AM  BP/Weight  Systolic BP 341 962 229 798 921 194 174  Diastolic BP 82 82 90 90 77 90 90  Wt. (Lbs)  237.04   234  232.04  BMI  35 kg/m2   34.56 kg/m2  34.27 kg/m2       IGT (impaired glucose tolerance) Patient educated      No data to display           Prediabetes Patient educated about the importance of limiting  Carbohydrate intake , the need to commit to daily physical activity for a minimum of 30 minutes , and to commit weight loss. The fact that changes in all these areas will reduce or eliminate all together the development of diabetes is stressed.      Latest Ref Rng & Units 11/21/2021   11:14 AM 05/09/2021    8:23 AM 10/31/2020   10:30 AM 09/09/2019    9:53 AM 02/08/2019    8:26 AM  Diabetic Labs  HbA1c 4.8 - 5.6 % 6.2     6.0   Chol 100 - 199 mg/dL  175  142  240    HDL >39 mg/dL  66  58  58  Calc LDL 0 - 99 mg/dL  89  70  157    Triglycerides 0 - 149 mg/dL  112  69  123    Creatinine 0.76 - 1.27 mg/dL 0.85  0.78  0.78  0.73  0.86       03/27/2022    4:48 PM 03/27/2022    4:33 PM 01/15/2022   10:05 AM 01/15/2022    9:51 AM 01/15/2022    9:46 AM 11/21/2021   10:05 AM 11/21/2021   10:04 AM  BP/Weight  Systolic BP 182 993 716 967 893 810 175  Diastolic BP 82 82 90 90 77 90 90  Wt. (Lbs)  237.04   234  232.04  BMI  35 kg/m2   34.56 kg/m2  34.27 kg/m2       No data to display          Updated lab needed .   Morbid obesity ([place])  Patient re-educated about  the importance of commitment to a  minimum of 150 minutes of exercise per week as able.  The importance of healthy food choices with portion control discussed, as well as eating regularly and within a 12 hour window most days. The need to choose  "clean , green" food 50 to 75% of the time is discussed, as well as to make water the primary drink and set a goal of 64 ounces water daily.       03/27/2022    4:33 PM 01/15/2022    9:46 AM 11/21/2021   10:04 AM  Weight /BMI  Weight 237 lb 0.6 oz 234 lb 232 lb 0.6 oz  Height '5\' 9"'$  (1.753 m) '5\' 9"'$  (1.753 m) '5\' 9"'$  (1.753 m)  BMI 35 kg/m2 34.56 kg/m2 34.27 kg/m2    Deteriorated  Hyperlipidemia LDL goal <100 Hyperlipidemia:Low fat diet discussed and encouraged.   Lipid Panel  Lab Results  Component Value Date   CHOL 175 05/09/2021   HDL 66 05/09/2021   LDLCALC 89 05/09/2021   TRIG 112 05/09/2021   CHOLHDL 2.7 05/09/2021     Updated lab needed .   Metabolic syndrome X The increased risk of cardiovascular disease associated with this diagnosis, and the need to consistently work on lifestyle to change this is discussed. Following  a  heart healthy diet ,commitment to 30 minutes of exercise at least 5 days per week, as well as control of blood sugar and cholesterol , and achieving a healthy weight are all the areas to be addressed .   Convulsions Controlled, no change in medication

## 2022-04-26 ENCOUNTER — Other Ambulatory Visit: Payer: Self-pay | Admitting: Family Medicine

## 2022-06-25 ENCOUNTER — Telehealth: Payer: Self-pay | Admitting: Family Medicine

## 2022-06-25 NOTE — Telephone Encounter (Signed)
Patient called asking does he have any allergies and does he take prescription for it if so. Patient call back # [phone].

## 2022-06-25 NOTE — Telephone Encounter (Signed)
I don't see any allergies on file and he is not on prednisone

## 2022-06-27 MED ORDER — FLUTICASONE PROPIONATE 50 MCG/ACT NA SUSP: 2.0000 | Freq: Every day | NASAL | 6 refills | Status: AC

## 2022-06-27 NOTE — Addendum Note (Signed)
Addended by: Kerri Perches on: 06/27/2022 08:52 AM   Modules accepted: Orders

## 2022-06-27 NOTE — Telephone Encounter (Signed)
Wants to know if he can have flonase called in for allergies.

## 2022-07-26 LAB — HEMOGLOBIN A1C
Est. average glucose Bld gHb Est-mCnc: 128 mg/dL
Hgb A1c MFr Bld: 6.1 % — ABNORMAL HIGH (ref 4.8–5.6)

## 2022-07-26 LAB — PSA, TOTAL AND FREE
PSA, Free Pct: 50 %
PSA, Free: 0.5 ng/mL
Prostate Specific Ag, Serum: 1 ng/mL (ref 0.0–4.0)

## 2022-07-26 LAB — CBC WITH DIFFERENTIAL/PLATELET
Basophils Absolute: 0 10*3/uL (ref 0.0–0.2)
Basos: 1 %
EOS (ABSOLUTE): 0.1 10*3/uL (ref 0.0–0.4)
Eos: 3 %
Hematocrit: 43.7 % (ref 37.5–51.0)
Hemoglobin: 14.8 g/dL (ref 13.0–17.7)
Immature Grans (Abs): 0 10*3/uL (ref 0.0–0.1)
Immature Granulocytes: 0 %
Lymphocytes Absolute: 2 10*3/uL (ref 0.7–3.1)
Lymphs: 44 %
MCH: 28.7 pg (ref 26.6–33.0)
MCHC: 33.9 g/dL (ref 31.5–35.7)
MCV: 85 fL (ref 79–97)
Monocytes Absolute: 0.3 10*3/uL (ref 0.1–0.9)
Monocytes: 8 %
Neutrophils Absolute: 2 10*3/uL (ref 1.4–7.0)
Neutrophils: 44 %
Platelets: 143 10*3/uL — ABNORMAL LOW (ref 150–450)
RBC: 5.16 x10E6/uL (ref 4.14–5.80)
RDW: 12.1 % (ref 11.6–15.4)
WBC: 4.5 10*3/uL (ref 3.4–10.8)

## 2022-07-26 LAB — CMP14+EGFR
ALT: 28 IU/L (ref 0–44)
AST: 24 IU/L (ref 0–40)
Albumin/Globulin Ratio: 1.7 (ref 1.2–2.2)
Albumin: 4.4 g/dL (ref 3.8–4.9)
Alkaline Phosphatase: 88 IU/L (ref 44–121)
BUN/Creatinine Ratio: 16 (ref 9–20)
BUN: 13 mg/dL (ref 6–24)
Bilirubin Total: 0.2 mg/dL (ref 0.0–1.2)
CO2: 25 mmol/L (ref 20–29)
Calcium: 9.8 mg/dL (ref 8.7–10.2)
Chloride: 102 mmol/L (ref 96–106)
Creatinine, Ser: 0.8 mg/dL (ref 0.76–1.27)
Globulin, Total: 2.6 g/dL (ref 1.5–4.5)
Glucose: 86 mg/dL (ref 70–99)
Potassium: 4.1 mmol/L (ref 3.5–5.2)
Sodium: 142 mmol/L (ref 134–144)
Total Protein: 7 g/dL (ref 6.0–8.5)
eGFR: 104 mL/min/{1.73_m2} (ref >59)

## 2022-07-26 LAB — LIPID PANEL
Chol/HDL Ratio: 2.2 ratio (ref 0.0–5.0)
Cholesterol, Total: 143 mg/dL (ref 100–199)
HDL: 64 mg/dL (ref >39)
LDL Chol Calc (NIH): 64 mg/dL (ref 0–99)
Triglycerides: 78 mg/dL (ref 0–149)
VLDL Cholesterol Cal: 15 mg/dL (ref 5–40)

## 2022-07-31 ENCOUNTER — Ambulatory Visit: Payer: BC Managed Care – PPO | Admitting: Family Medicine

## 2022-07-31 ENCOUNTER — Encounter: Payer: Self-pay | Admitting: Family Medicine

## 2022-07-31 VITALS — BP 134/84 | HR 74 | Ht 69.0 in | Wt 238.1 lb

## 2022-07-31 DIAGNOSIS — R7303 Prediabetes: Secondary | ICD-10-CM | POA: Diagnosis not present

## 2022-07-31 DIAGNOSIS — E559 Vitamin D deficiency, unspecified: Secondary | ICD-10-CM

## 2022-07-31 DIAGNOSIS — E785 Hyperlipidemia, unspecified: Secondary | ICD-10-CM | POA: Diagnosis not present

## 2022-07-31 DIAGNOSIS — R7302 Impaired glucose tolerance (oral): Secondary | ICD-10-CM

## 2022-07-31 DIAGNOSIS — I10 Essential (primary) hypertension: Secondary | ICD-10-CM | POA: Diagnosis not present

## 2022-07-31 DIAGNOSIS — R569 Unspecified convulsions: Secondary | ICD-10-CM

## 2022-07-31 DIAGNOSIS — F322 Major depressive disorder, single episode, severe without psychotic features: Secondary | ICD-10-CM

## 2022-07-31 NOTE — Patient Instructions (Addendum)
Annual exam in mid to end October, call if you need me sooner  Excellent labs and BP, no med change  Blood sugar improving  Thankful that overall you are improving  HBA1C, cmp and EGFr, Dilantin level and vit D level 3 to 5 days before next visit, non fasting  It is important that you exercise regularly at least 30 minutes 5 times a week. If you develop chest pain, have severe difficulty breathing, or feel very tired, stop exercising immediately and seek medical attention  Thanks for choosing [facility], we consider it a privelige to serve you.

## 2022-08-02 ENCOUNTER — Encounter: Payer: Self-pay | Admitting: Family Medicine

## 2022-08-02 NOTE — Assessment & Plan Note (Signed)
Unchanged  Patient re-educated about  the importance of commitment to a  minimum of 150 minutes of exercise per week as able.  The importance of healthy food choices with portion control discussed, as well as eating regularly and within a 12 hour window most days. The need to choose "clean , green" food 50 to 75% of the time is discussed, as well as to make water the primary drink and set a goal of 64 ounces water daily.       07/31/2022    3:53 PM 03/27/2022    4:33 PM 01/15/2022    9:46 AM  Weight /BMI  Weight 238 lb 1.3 oz 237 lb 0.6 oz 234 lb  Height 5\' 9"  (1.753 m) 5\' 9"  (1.753 m) 5\' 9"  (1.753 m)  BMI 35.16 kg/m2 35 kg/m2 34.56 kg/m2

## 2022-08-02 NOTE — Progress Notes (Signed)
Reginald Gonzales.     MRN: [id number]      DOB: 28-Oct-1965  Chief Complaint  Patient presents with   Follow-up    Follow up    HPI Reginald Gonzales is here for follow up and re-evaluation of chronic medical conditions, medication management and review of any available recent lab and radiology data.  Preventive health is updated, specifically  Cancer screening and Immunization.   Questions or concerns regarding consultations or procedures which the PT has had in the interim are  addressed. The PT denies any adverse reactions to current medications since the last visit.  There are no new concerns.  There are no specific complaints   ROS Denies recent fever or chills. Denies sinus pressure, nasal congestion, ear pain or sore throat. Denies chest congestion, productive cough or wheezing. Denies chest pains, palpitations and leg swelling Denies abdominal pain, nausea, vomiting,diarrhea or constipation.   Denies dysuria, frequency, hesitancy or incontinence. Denies joint pain, swelling and limitation in mobility. Denies headaches, seizures, numbness, or tingling. Denies depression, anxiety or insomnia. Denies skin break down or rash.   PE  BP 134/84   Pulse 74   Ht 5\' 9"  (1.753 m)   Wt 238 lb 1.3 oz (108 kg)   SpO2 97%   BMI 35.16 kg/m   Patient alert and oriented and in no cardiopulmonary distress.  HEENT: No facial asymmetry, EOMI,     Neck supple .  Chest: Clear to auscultation bilaterally.  CVS: S1, S2 no murmurs, no S3.Regular rate.  ABD: Soft non tender.   Ext: No edema  MS: Adequate ROM spine, shoulders, hips and knees.  Skin: Intact, no ulcerations or rash noted.  Psych: Good eye contact, normal affect. Memory intact not anxious or depressed appearing.  CNS: CN 2-12 intact, power,  normal throughout.no focal deficits noted.   Assessment & Plan  Essential hypertension, benign Controlled, no change in medication DASH diet and commitment to daily  physical activity for a minimum of 30 minutes discussed and encouraged, as a part of hypertension management. The importance of attaining a healthy weight is also discussed.     07/31/2022    4:42 PM 07/31/2022    3:54 PM 07/31/2022    3:53 PM 03/27/2022    4:48 PM 03/27/2022    4:33 PM 01/15/2022   10:05 AM 01/15/2022    9:51 AM  BP/Weight  Systolic BP 134 146 141 128 120 142 142  Diastolic BP 84 96 87 82 82 90 90  Wt. (Lbs)   238.08  237.04    BMI   35.16 kg/m2  35 kg/m2         Hyperlipidemia LDL goal <100 Hyperlipidemia:Low fat diet discussed and encouraged.   Lipid Panel  Lab Results  Component Value Date   CHOL 143 07/25/2022   HDL 64 07/25/2022   LDLCALC 64 07/25/2022   TRIG 78 07/25/2022   CHOLHDL 2.2 07/25/2022     Controlled, no change in medication   Morbid obesity (HCC) Unchanged  Patient re-educated about  the importance of commitment to a  minimum of 150 minutes of exercise per week as able.  The importance of healthy food choices with portion control discussed, as well as eating regularly and within a 12 hour window most days. The need to choose "clean , green" food 50 to 75% of the time is discussed, as well as to make water the primary drink and set a goal of 64 ounces water daily.  07/31/2022    3:53 PM 03/27/2022    4:33 PM 01/15/2022    9:46 AM  Weight /BMI  Weight 238 lb 1.3 oz 237 lb 0.6 oz 234 lb  Height 5\' 9"  (1.753 m) 5\' 9"  (1.753 m) 5\' 9"  (1.753 m)  BMI 35.16 kg/m2 35 kg/m2 34.56 kg/m2      Depression, major, single episode, severe (HCC) Resolved and on no medication, trigger was loss of spouse in 2023  Convulsions Controlled , denies any seizures Updated lab needed at/ before next visit.   Prediabetes Improving which is  great Patient educated about the importance of limiting  Carbohydrate intake , the need to commit to daily physical activity for a minimum of 30 minutes , and to commit weight loss. The fact that changes  in all these areas will reduce or eliminate all together the development of diabetes is stressed.      Latest Ref Rng & Units 07/25/2022    8:58 AM 11/21/2021   11:14 AM 05/09/2021    8:23 AM 10/31/2020   10:30 AM 09/09/2019    9:53 AM  Diabetic Labs  HbA1c 4.8 - 5.6 % 6.1  6.2      Chol 100 - 199 mg/dL 161   096  045  409   HDL >39 mg/dL 64   66  58  58   Calc LDL 0 - 99 mg/dL 64   89  70  811   Triglycerides 0 - 149 mg/dL 78   914  69  782   Creatinine 0.76 - 1.27 mg/dL 9.56  2.13  0.86  5.78  0.73       07/31/2022    4:42 PM 07/31/2022    3:54 PM 07/31/2022    3:53 PM 03/27/2022    4:48 PM 03/27/2022    4:33 PM 01/15/2022   10:05 AM 01/15/2022    9:51 AM  BP/Weight  Systolic BP 134 146 141 128 120 142 142  Diastolic BP 84 96 87 82 82 90 90  Wt. (Lbs)   238.08  237.04    BMI   35.16 kg/m2  35 kg/m2         No data to display

## 2022-08-02 NOTE — Assessment & Plan Note (Signed)
Controlled, no change in medication DASH diet and commitment to daily physical activity for a minimum of 30 minutes discussed and encouraged, as a part of hypertension management. The importance of attaining a healthy weight is also discussed.     07/31/2022    4:42 PM 07/31/2022    3:54 PM 07/31/2022    3:53 PM 03/27/2022    4:48 PM 03/27/2022    4:33 PM 01/15/2022   10:05 AM 01/15/2022    9:51 AM  BP/Weight  Systolic BP 134 146 141 128 120 142 142  Diastolic BP 84 96 87 82 82 90 90  Wt. (Lbs)   238.08  237.04    BMI   35.16 kg/m2  35 kg/m2

## 2022-08-02 NOTE — Assessment & Plan Note (Signed)
Hyperlipidemia:Low fat diet discussed and encouraged.   Lipid Panel  Lab Results  Component Value Date   CHOL 143 07/25/2022   HDL 64 07/25/2022   LDLCALC 64 07/25/2022   TRIG 78 07/25/2022   CHOLHDL 2.2 07/25/2022     Controlled, no change in medication

## 2022-08-02 NOTE — Assessment & Plan Note (Signed)
Improving which is  great Patient educated about the importance of limiting  Carbohydrate intake , the need to commit to daily physical activity for a minimum of 30 minutes , and to commit weight loss. The fact that changes in all these areas will reduce or eliminate all together the development of diabetes is stressed.      Latest Ref Rng & Units 07/25/2022    8:58 AM 11/21/2021   11:14 AM 05/09/2021    8:23 AM 10/31/2020   10:30 AM 09/09/2019    9:53 AM  Diabetic Labs  HbA1c 4.8 - 5.6 % 6.1  6.2      Chol 100 - 199 mg/dL 409   811  914  782   HDL >39 mg/dL 64   66  58  58   Calc LDL 0 - 99 mg/dL 64   89  70  956   Triglycerides 0 - 149 mg/dL 78   213  69  086   Creatinine 0.76 - 1.27 mg/dL 5.78  4.69  6.29  5.28  0.73       07/31/2022    4:42 PM 07/31/2022    3:54 PM 07/31/2022    3:53 PM 03/27/2022    4:48 PM 03/27/2022    4:33 PM 01/15/2022   10:05 AM 01/15/2022    9:51 AM  BP/Weight  Systolic BP 134 146 141 128 120 142 142  Diastolic BP 84 96 87 82 82 90 90  Wt. (Lbs)   238.08  237.04    BMI   35.16 kg/m2  35 kg/m2         No data to display

## 2022-08-02 NOTE — Assessment & Plan Note (Signed)
Resolved and on no medication, trigger was loss of spouse in 2023

## 2022-08-02 NOTE — Assessment & Plan Note (Signed)
Controlled , denies any seizures Updated lab needed at/ before next visit.

## 2022-09-17 ENCOUNTER — Other Ambulatory Visit: Payer: Self-pay | Admitting: Family Medicine

## 2022-09-24 ENCOUNTER — Other Ambulatory Visit: Payer: Self-pay | Admitting: Family Medicine

## 2022-10-23 ENCOUNTER — Other Ambulatory Visit: Payer: Self-pay | Admitting: Family Medicine

## 2022-11-21 ENCOUNTER — Telehealth: Payer: Self-pay | Admitting: Family Medicine

## 2022-11-21 ENCOUNTER — Other Ambulatory Visit: Payer: Self-pay

## 2022-11-21 MED ORDER — ERGOCALCIFEROL 1.25 MG (50000 UT) PO CAPS: 50000.0000 [IU] | ORAL_CAPSULE | ORAL | 0 refills | Status: DC

## 2022-11-21 NOTE — Telephone Encounter (Signed)
Prescription Request  11/21/2022  LOV: 07/31/2022  What is the name of the medication or equipment? ergocalciferol (VITAMIN D2) 1.25 MG (50000 UT) capsule   Have you contacted your pharmacy to request a refill? Yes   Which pharmacy would you like this sent to? [facility]   Patient notified that their request is being sent to the clinical staff for review and that they should receive a response within 2 business days.   Please advise at Mobile [phone] (mobile)

## 2022-11-21 NOTE — Telephone Encounter (Signed)
Med refilled.

## 2022-12-23 ENCOUNTER — Encounter: Payer: Self-pay | Admitting: Family Medicine

## 2022-12-23 ENCOUNTER — Ambulatory Visit (INDEPENDENT_AMBULATORY_CARE_PROVIDER_SITE_OTHER): Payer: BC Managed Care – PPO | Admitting: Family Medicine

## 2022-12-23 VITALS — BP 126/85 | HR 79 | Ht 69.0 in | Wt 240.1 lb

## 2022-12-23 DIAGNOSIS — Z0001 Encounter for general adult medical examination with abnormal findings: Secondary | ICD-10-CM | POA: Diagnosis not present

## 2022-12-23 DIAGNOSIS — I10 Essential (primary) hypertension: Secondary | ICD-10-CM | POA: Diagnosis not present

## 2022-12-23 DIAGNOSIS — Z23 Encounter for immunization: Secondary | ICD-10-CM

## 2022-12-23 DIAGNOSIS — E785 Hyperlipidemia, unspecified: Secondary | ICD-10-CM

## 2022-12-23 DIAGNOSIS — R7303 Prediabetes: Secondary | ICD-10-CM | POA: Diagnosis not present

## 2022-12-23 DIAGNOSIS — R7302 Impaired glucose tolerance (oral): Secondary | ICD-10-CM

## 2022-12-23 DIAGNOSIS — E559 Vitamin D deficiency, unspecified: Secondary | ICD-10-CM

## 2022-12-23 DIAGNOSIS — R569 Unspecified convulsions: Secondary | ICD-10-CM

## 2022-12-23 NOTE — Progress Notes (Signed)
   Reginald Gonzales.     MRN: [id number]      DOB: [birth year]  Chief Complaint  Patient presents with   Annual Exam    CPE , flu shot    HPI: Patient is in for annual physical exam. No other health concerns are expressed or addressed at the visit. Recent labs, if available are reviewed. Immunization is reviewed , and  updated if needed.    PE; BP 126/85 (BP Location: Right Arm, Patient Position: Sitting, Cuff Size: Large)   Pulse 79   Ht 5\' 9"  (1.753 m)   Wt 240 lb 1.3 oz (108.9 kg)   SpO2 97%   BMI 35.45 kg/m   Pleasant male, alert and oriented x 3, in no cardio-pulmonary distress. Afebrile. HEENT No facial trauma or asymetry. Sinuses non tender. EOMI External ears normal,  Neck: supple, no adenopathy,JVD or thyromegaly.No bruits.  Chest: Clear to ascultation bilaterally.No crackles or wheezes. Non tender to palpation  Cardiovascular system; Heart sounds normal,  S1 and  S2 ,no S3.  No murmur, or thrill. Apical beat not displaced Peripheral pulses normal.  Abdomen: Soft, non tender, no organomegaly or masses.  Musculoskeletal exam: Full ROM of spine, hips , shoulders and knees. No deformity ,swelling or crepitus noted. No muscle wasting or atrophy.   Neurologic: Cranial nerves 2 to 12 intact. Power, tone ,sensation and reflexes normal throughout. No disturbance in gait. No tremor.  Skin: Intact, no ulceration, erythema , scaling or rash noted. Pigmentation normal throughout  Psych; Normal mood and affect. Judgement and concentration normal   Assessment & Plan:  Encounter for annual general medical examination with abnormal findings in adult Annual exam as documented. Counseling done  re healthy lifestyle involving commitment to 150 minutes exercise per week, heart healthy diet, and attaining healthy weight.The importance of adequate sleep also discussed. Regular seat belt use and home safety, is also discussed. Changes in health habits are  decided on by the patient with goals and time frames  set for achieving them. Immunization and cancer screening needs are specifically addressed at this visit.   Immunization due After obtaining informed consent, the vaccine is  administered , with no adverse effect noted at the time of administration.

## 2022-12-23 NOTE — Assessment & Plan Note (Signed)

## 2022-12-23 NOTE — Patient Instructions (Addendum)
F/U in 4 months,re evaluate weight  Excellent blood pressure  Fasting lipid, cmp and EGFr, HBA1C, tSH, CBC, dilantin level and vitD week of Novemebr 11  Flu vaccine today  Please get covid vaccne at your pharmacy  Keep up healthy decision making    Thanks for choosing [facility], we consider it a privelige to serve you.

## 2022-12-23 NOTE — Assessment & Plan Note (Signed)
After obtaining informed consent, the vaccine is  administered , with no adverse effect noted at the time of administration.  

## 2022-12-28 ENCOUNTER — Other Ambulatory Visit: Payer: Self-pay | Admitting: Family Medicine

## 2023-01-21 ENCOUNTER — Other Ambulatory Visit: Payer: Self-pay | Admitting: Family Medicine

## 2023-01-22 LAB — TSH: TSH: 1.78 u[IU]/mL (ref 0.450–4.500)

## 2023-01-22 LAB — CBC
Hematocrit: 44.6 % (ref 37.5–51.0)
Hemoglobin: 15.2 g/dL (ref 13.0–17.7)
MCH: 29.2 pg (ref 26.6–33.0)
MCHC: 34.1 g/dL (ref 31.5–35.7)
MCV: 86 fL (ref 79–97)
Platelets: 149 10*3/uL — ABNORMAL LOW (ref 150–450)
RBC: 5.2 x10E6/uL (ref 4.14–5.80)
RDW: 13.3 % (ref 11.6–15.4)
WBC: 4.7 10*3/uL (ref 3.4–10.8)

## 2023-01-22 LAB — LIPID PANEL
Chol/HDL Ratio: 2.5 {ratio} (ref 0.0–5.0)
Cholesterol, Total: 165 mg/dL (ref 100–199)
HDL: 65 mg/dL (ref >39)
LDL Chol Calc (NIH): 84 mg/dL (ref 0–99)
Triglycerides: 85 mg/dL (ref 0–149)
VLDL Cholesterol Cal: 16 mg/dL (ref 5–40)

## 2023-01-22 LAB — CMP14+EGFR
ALT: 26 [IU]/L (ref 0–44)
AST: 20 [IU]/L (ref 0–40)
Albumin: 4.5 g/dL (ref 3.8–4.9)
Alkaline Phosphatase: 93 [IU]/L (ref 44–121)
BUN/Creatinine Ratio: 14 (ref 9–20)
BUN: 11 mg/dL (ref 6–24)
Bilirubin Total: 0.4 mg/dL (ref 0.0–1.2)
CO2: 25 mmol/L (ref 20–29)
Calcium: 9.8 mg/dL (ref 8.7–10.2)
Chloride: 102 mmol/L (ref 96–106)
Creatinine, Ser: 0.8 mg/dL (ref 0.76–1.27)
Globulin, Total: 2.8 g/dL (ref 1.5–4.5)
Glucose: 80 mg/dL (ref 70–99)
Potassium: 3.9 mmol/L (ref 3.5–5.2)
Sodium: 141 mmol/L (ref 134–144)
Total Protein: 7.3 g/dL (ref 6.0–8.5)
eGFR: 103 mL/min/{1.73_m2} (ref >59)

## 2023-01-22 LAB — VITAMIN D 25 HYDROXY (VIT D DEFICIENCY, FRACTURES): Vit D, 25-Hydroxy: 90 ng/mL (ref 30.0–100.0)

## 2023-01-22 LAB — PHENYTOIN LEVEL, TOTAL: Phenytoin (Dilantin), Serum: 13.5 ug/mL (ref 10.0–20.0)

## 2023-01-22 LAB — HEMOGLOBIN A1C
Est. average glucose Bld gHb Est-mCnc: 131 mg/dL
Hgb A1c MFr Bld: 6.2 % — ABNORMAL HIGH (ref 4.8–5.6)

## 2023-02-25 ENCOUNTER — Other Ambulatory Visit: Payer: Self-pay | Admitting: Family Medicine

## 2023-03-02 NOTE — Telephone Encounter (Signed)
Copied from CRM [id number]. Topic: Clinical - Medication Question >> Feb 27, 2023  2:06 PM Donita Brooks wrote: Reason for CRM: what kind of vitman d over the counter. pt mention they told him what kind to get, he just forgot - callback - [phone]

## 2023-03-13 ENCOUNTER — Telehealth: Payer: Self-pay | Admitting: Family Medicine

## 2023-03-13 NOTE — Telephone Encounter (Signed)
 Patient aware.

## 2023-03-13 NOTE — Telephone Encounter (Signed)
 Patient forgot what vitamin D otc to get. Call patient to let him know [phone]

## 2023-04-28 ENCOUNTER — Ambulatory Visit: Payer: 59 | Admitting: Family Medicine

## 2023-04-28 ENCOUNTER — Encounter: Payer: Self-pay | Admitting: Family Medicine

## 2023-04-28 VITALS — BP 125/75 | HR 81 | Ht 69.0 in | Wt 233.1 lb

## 2023-04-28 DIAGNOSIS — Z125 Encounter for screening for malignant neoplasm of prostate: Secondary | ICD-10-CM

## 2023-04-28 DIAGNOSIS — I10 Essential (primary) hypertension: Secondary | ICD-10-CM

## 2023-04-28 DIAGNOSIS — R7302 Impaired glucose tolerance (oral): Secondary | ICD-10-CM

## 2023-04-28 DIAGNOSIS — R569 Unspecified convulsions: Secondary | ICD-10-CM | POA: Diagnosis not present

## 2023-04-28 DIAGNOSIS — E559 Vitamin D deficiency, unspecified: Secondary | ICD-10-CM

## 2023-04-28 DIAGNOSIS — R7303 Prediabetes: Secondary | ICD-10-CM

## 2023-04-28 DIAGNOSIS — E785 Hyperlipidemia, unspecified: Secondary | ICD-10-CM

## 2023-04-28 NOTE — Patient Instructions (Addendum)
 Annual exam early September   Keep up weight loss through healthy food choice  Fasting lipid, cmp and EGFR, HBA1C , PSA , phenytoin level ad Vit D  to be drawn end May  It is important that you exercise regularly at least 30 minutes 5 times a week. If you develop chest pain, have severe difficulty breathing, or feel very tired, stop exercising immediately and seek medical attention   Think about what you will eat, plan ahead. Choose " clean, green, fresh or frozen" over canned, processed or packaged foods which are more sugary, salty and fatty. 70 to 75% of food eaten should be vegetables and fruit. Three meals at set times with snacks allowed between meals, but they must be fruit or vegetables. Aim to eat over a 12 hour period , example 7 am to 7 pm, and STOP after  your last meal of the day. Drink water,generally about 64 ounces per day, no other drink is as healthy. Fruit juice is best enjoyed in a healthy way, by EATING the fruit. Thanks for choosing [facility], we consider it a privelige to serve you.

## 2023-05-01 DIAGNOSIS — E559 Vitamin D deficiency, unspecified: Secondary | ICD-10-CM | POA: Insufficient documentation

## 2023-05-01 DIAGNOSIS — Z125 Encounter for screening for malignant neoplasm of prostate: Secondary | ICD-10-CM | POA: Insufficient documentation

## 2023-05-01 NOTE — Progress Notes (Signed)
 Reginald Gonzales.     MRN: [id number]      DOB: May 09, 1965  Chief Complaint  Patient presents with   Follow-up    Follow up    HPI Reginald Gonzales is here for follow up and re-evaluation of chronic medical conditions, medication management and review of any available recent lab and radiology data.  Preventive health is updated, specifically  Cancer screening and Immunization.   Questions or concerns regarding consultations or procedures which the PT has had in the interim are  addressed. The PT denies any adverse reactions to current medications since the last visit.  There are no new concerns.  There are no specific complaints   ROS Denies recent fever or chills. Denies sinus pressure, nasal congestion, ear pain or sore throat. Denies chest congestion, productive cough or wheezing. Denies chest pains, palpitations and leg swelling Denies abdominal pain, nausea, vomiting,diarrhea or constipation.   Denies dysuria, frequency, hesitancy or incontinence. Denies joint pain, swelling and limitation in mobility. Denies headaches, seizures, numbness, or tingling. Denies depression, anxiety or insomnia. Denies skin break down or rash.   PE  BP 125/75 (BP Location: Right Arm, Patient Position: Sitting, Cuff Size: Large)   Pulse 81   Ht 5\' 9"  (1.753 m)   Wt 233 lb 1.9 oz (105.7 kg)   SpO2 98%   BMI 34.43 kg/m   Patient alert and oriented and in no cardiopulmonary distress.  HEENT: No facial asymmetry, EOMI,     Neck supple .  Chest: Clear to auscultation bilaterally.  CVS: S1, S2 no murmurs, no S3.Regular rate.  ABD: Soft non tender.   Ext: No edema  MS: Adequate ROM spine, shoulders, hips and knees.  Skin: Intact, no ulcerations or rash noted.  Psych: Good eye contact, normal affect. Memory intact not anxious or depressed appearing.  CNS: CN 2-12 intact, power,  normal throughout.no focal deficits noted.   Assessment & Plan  Essential hypertension,  benign Controlled, no change in medication DASH diet and commitment to daily physical activity for a minimum of 30 minutes discussed and encouraged, as a part of hypertension management. The importance of attaining a healthy weight is also discussed.     04/28/2023    4:05 PM 12/23/2022    4:13 PM 07/31/2022    4:42 PM 07/31/2022    3:54 PM 07/31/2022    3:53 PM 03/27/2022    4:48 PM 03/27/2022    4:33 PM  BP/Weight  Systolic BP 125 126 134 146 141 128 120  Diastolic BP 75 85 84 96 87 82 82  Wt. (Lbs) 233.12 240.08   238.08  237.04  BMI 34.43 kg/m2 35.45 kg/m2   35.16 kg/m2  35 kg/m2       Hyperlipidemia LDL goal <100 Hyperlipidemia:Low fat diet discussed and encouraged.   Lipid Panel  Lab Results  Component Value Date   CHOL 165 01/20/2023   HDL 65 01/20/2023   LDLCALC 84 01/20/2023   TRIG 85 01/20/2023   CHOLHDL 2.5 01/20/2023     Updated lab needed at/ before next visit.   Convulsions None reported , check phenytoin level  Morbid obesity (HCC)  Patient re-educated about  the importance of commitment to a  minimum of 150 minutes of exercise per week as able.  The importance of healthy food choices with portion control discussed, as well as eating regularly and within a 12 hour window most days. The need to choose "clean , green" food 50 to 75% of  the time is discussed, as well as to make water the primary drink and set a goal of 64 ounces water daily.       04/28/2023    4:05 PM 12/23/2022    4:13 PM 07/31/2022    3:53 PM  Weight /BMI  Weight 233 lb 1.9 oz 240 lb 1.3 oz 238 lb 1.3 oz  Height 5\' 9"  (1.753 m) 5\' 9"  (1.753 m) 5\' 9"  (1.753 m)  BMI 34.43 kg/m2 35.45 kg/m2 35.16 kg/m2    Significant weiht loss he is applauded on this  Prediabetes Patient educated about the importance of limiting  Carbohydrate intake , the need to commit to daily physical activity for a minimum of 30 minutes , and to commit weight loss. The fact that changes in all these areas  will reduce or eliminate all together the development of diabetes is stressed.      Latest Ref Rng & Units 01/20/2023    2:05 PM 07/25/2022    8:58 AM 11/21/2021   11:14 AM 05/09/2021    8:23 AM 10/31/2020   10:30 AM  Diabetic Labs  HbA1c 4.8 - 5.6 % 6.2  6.1  6.2     Chol 100 - 199 mg/dL 409  811   914  782   HDL >39 mg/dL 65  64   66  58   Calc LDL 0 - 99 mg/dL 84  64   89  70   Triglycerides 0 - 149 mg/dL 85  78   956  69   Creatinine 0.76 - 1.27 mg/dL 2.13  0.86  5.78  4.69  0.78       04/28/2023    4:05 PM 12/23/2022    4:13 PM 07/31/2022    4:42 PM 07/31/2022    3:54 PM 07/31/2022    3:53 PM 03/27/2022    4:48 PM 03/27/2022    4:33 PM  BP/Weight  Systolic BP 125 126 134 146 141 128 120  Diastolic BP 75 85 84 96 87 82 82  Wt. (Lbs) 233.12 240.08   238.08  237.04  BMI 34.43 kg/m2 35.45 kg/m2   35.16 kg/m2  35 kg/m2       No data to display          Updated lab needed at/ before next visit.   Vitamin D deficiency Updated lab needed at/ before next visit.   Screening for prostate cancer PSa ordwred for when due on annual basis

## 2023-05-01 NOTE — Assessment & Plan Note (Signed)
 Controlled, no change in medication DASH diet and commitment to daily physical activity for a minimum of 30 minutes discussed and encouraged, as a part of hypertension management. The importance of attaining a healthy weight is also discussed.     04/28/2023    4:05 PM 12/23/2022    4:13 PM 07/31/2022    4:42 PM 07/31/2022    3:54 PM 07/31/2022    3:53 PM 03/27/2022    4:48 PM 03/27/2022    4:33 PM  BP/Weight  Systolic BP 125 126 134 146 141 128 120  Diastolic BP 75 85 84 96 87 82 82  Wt. (Lbs) 233.12 240.08   238.08  237.04  BMI 34.43 kg/m2 35.45 kg/m2   35.16 kg/m2  35 kg/m2

## 2023-05-01 NOTE — Assessment & Plan Note (Signed)
 Updated lab needed at/ before next visit.

## 2023-05-01 NOTE — Assessment & Plan Note (Signed)
  Patient re-educated about  the importance of commitment to a  minimum of 150 minutes of exercise per week as able.  The importance of healthy food choices with portion control discussed, as well as eating regularly and within a 12 hour window most days. The need to choose "clean , green" food 50 to 75% of the time is discussed, as well as to make water the primary drink and set a goal of 64 ounces water daily.       04/28/2023    4:05 PM 12/23/2022    4:13 PM 07/31/2022    3:53 PM  Weight /BMI  Weight 233 lb 1.9 oz 240 lb 1.3 oz 238 lb 1.3 oz  Height 5\' 9"  (1.753 m) 5\' 9"  (1.753 m) 5\' 9"  (1.753 m)  BMI 34.43 kg/m2 35.45 kg/m2 35.16 kg/m2    Significant weiht loss he is applauded on this

## 2023-05-01 NOTE — Assessment & Plan Note (Signed)
 PSa ordwred for when due on annual basis

## 2023-05-01 NOTE — Assessment & Plan Note (Signed)
 None reported , check phenytoin level

## 2023-05-01 NOTE — Assessment & Plan Note (Signed)
 Patient educated about the importance of limiting  Carbohydrate intake , the need to commit to daily physical activity for a minimum of 30 minutes , and to commit weight loss. The fact that changes in all these areas will reduce or eliminate all together the development of diabetes is stressed.      Latest Ref Rng & Units 01/20/2023    2:05 PM 07/25/2022    8:58 AM 11/21/2021   11:14 AM 05/09/2021    8:23 AM 10/31/2020   10:30 AM  Diabetic Labs  HbA1c 4.8 - 5.6 % 6.2  6.1  6.2     Chol 100 - 199 mg/dL 161  096   045  409   HDL >39 mg/dL 65  64   66  58   Calc LDL 0 - 99 mg/dL 84  64   89  70   Triglycerides 0 - 149 mg/dL 85  78   811  69   Creatinine 0.76 - 1.27 mg/dL 9.14  7.82  9.56  2.13  0.78       04/28/2023    4:05 PM 12/23/2022    4:13 PM 07/31/2022    4:42 PM 07/31/2022    3:54 PM 07/31/2022    3:53 PM 03/27/2022    4:48 PM 03/27/2022    4:33 PM  BP/Weight  Systolic BP 125 126 134 146 141 128 120  Diastolic BP 75 85 84 96 87 82 82  Wt. (Lbs) 233.12 240.08   238.08  237.04  BMI 34.43 kg/m2 35.45 kg/m2   35.16 kg/m2  35 kg/m2       No data to display          Updated lab needed at/ before next visit.

## 2023-05-01 NOTE — Assessment & Plan Note (Signed)
 Hyperlipidemia:Low fat diet discussed and encouraged.   Lipid Panel  Lab Results  Component Value Date   CHOL 165 01/20/2023   HDL 65 01/20/2023   LDLCALC 84 01/20/2023   TRIG 85 01/20/2023   CHOLHDL 2.5 01/20/2023     Updated lab needed at/ before next visit.

## 2023-05-28 ENCOUNTER — Telehealth: Payer: Self-pay

## 2023-05-28 NOTE — Telephone Encounter (Signed)
 Copied from CRM [id number]. Topic: Clinical - Medical Advice >> May 28, 2023 10:47 AM Elle L wrote: Reason for CRM: The patient [facility] email from [facility] regarding the pneumococcal vaccination for patients over the age of 88 and is requesting to see if this is something he should do. The patient's call back number is [phone].

## 2023-11-01 ENCOUNTER — Other Ambulatory Visit: Payer: Self-pay | Admitting: Family Medicine

## 2023-11-09 ENCOUNTER — Other Ambulatory Visit: Payer: Self-pay | Admitting: Family Medicine

## 2023-12-24 ENCOUNTER — Encounter: Payer: 59 | Admitting: Family Medicine

## 2024-01-20 ENCOUNTER — Encounter: Admitting: Family Medicine

## 2024-04-05 ENCOUNTER — Ambulatory Visit (INDEPENDENT_AMBULATORY_CARE_PROVIDER_SITE_OTHER): Payer: Self-pay | Admitting: Family Medicine

## 2024-04-05 ENCOUNTER — Encounter: Payer: Self-pay | Admitting: Family Medicine

## 2024-04-05 VITALS — BP 120/80 | HR 99 | Resp 16 | Ht 69.0 in | Wt 243.0 lb

## 2024-04-05 DIAGNOSIS — Z125 Encounter for screening for malignant neoplasm of prostate: Secondary | ICD-10-CM | POA: Diagnosis not present

## 2024-04-05 DIAGNOSIS — R569 Unspecified convulsions: Secondary | ICD-10-CM

## 2024-04-05 DIAGNOSIS — R7303 Prediabetes: Secondary | ICD-10-CM

## 2024-04-05 DIAGNOSIS — E785 Hyperlipidemia, unspecified: Secondary | ICD-10-CM

## 2024-04-05 DIAGNOSIS — I10 Essential (primary) hypertension: Secondary | ICD-10-CM | POA: Diagnosis not present

## 2024-04-05 DIAGNOSIS — Z0001 Encounter for general adult medical examination with abnormal findings: Secondary | ICD-10-CM

## 2024-04-05 DIAGNOSIS — E559 Vitamin D deficiency, unspecified: Secondary | ICD-10-CM

## 2024-04-05 NOTE — Patient Instructions (Addendum)
 F/U mid to end August , Dr Antonetta  Nurse visit in 1 week for Hep B#1 and pnemonia vaccine  Nurse visit in 10 weeks for Hep B #2  Annual exam with Dr Tobie in 1 year  Labs today already ordered on  04/28/2023, pls add cBC  Please change food and  drink choice as was discussed  Weight loss goal of 10 pounds in next 7 months, you can do it!  Thanks for choosing [facility], we consider it a privelige to serve you.

## 2024-04-05 NOTE — Progress Notes (Signed)
" ° °  Reginald R Loewen Jr.     MRN: [id number]      DOB: 11-28-1965  Chief Complaint  Patient presents with   Annual Exam    Cpe     HPI: Patient is in for annual physical exam. No other health concerns are expressed or addressed at the visit. Recent labs, if available are reviewed. Immunization is reviewed , and  updated if needed.    PE; BP 120/80   Pulse 99   Resp 16   Ht 5' 9 (1.753 m)   Wt 243 lb (110.2 kg)   SpO2 96%   BMI 35.88 kg/m   Pleasant male, alert and oriented x 3, in no cardio-pulmonary distress. Afebrile. HEENT No facial trauma or asymetry. Sinuses non tender. EOMI External ears normal,  Neck: supple, no adenopathy,JVD or thyromegaly.No bruits.  Chest: Clear to ascultation bilaterally.No crackles or wheezes. Non tender to palpation  Cardiovascular system; Heart sounds normal,  S1 and  S2 ,no S3.  No murmur, or thrill. Apical beat not displaced Peripheral pulses normal.  Abdomen: Soft, non tender, no organomegaly or masses. No bruits. Bowel sounds normal. No guarding, tenderness or rebound.    Musculoskeletal exam: Full ROM of spine, hips , shoulders and knees. No deformity ,swelling or crepitus noted. No muscle wasting or atrophy.   Neurologic: Cranial nerves 2 to 12 intact. Power, tone ,sensation and reflexes normal throughout. No disturbance in gait. No tremor.  Skin: Intact, no ulceration, erythema , scaling or rash noted. Pigmentation normal throughout  Psych; Normal mood and affect. Judgement and concentration normal   Assessment & Plan:  Encounter for annual general medical examination with abnormal findings in adult Annual exam as documented. Counseling done  re healthy lifestyle involving commitment to 150 minutes exercise per week, heart healthy diet, and attaining healthy weight.The importance of adequate sleep also discussed. Regular seat belt use and home safety, is also discussed. Changes in health habits are  decided on by the patient with goals and time frames  set for achieving them. Immunization and cancer screening needs are specifically addressed at this visit.   Morbid obesity (HCC)  Patient re-educated about  the importance of commitment to a  minimum of 150 minutes of exercise per week as able.  The importance of healthy food choices with portion control discussed, as well as eating regularly and within a 12 hour window most days. The need to choose clean , green food 50 to 75% of the time is discussed, as well as to make water  the primary drink and set a goal of 64 ounces water  daily.       04/05/2024    2:55 PM 04/28/2023    4:05 PM 12/23/2022    4:13 PM  Weight /BMI  Weight 243 lb 233 lb 1.9 oz 240 lb 1.3 oz  Height 5' 9 (1.753 m) 5' 9 (1.753 m) 5' 9 (1.753 m)  BMI 35.88 kg/m2 34.43 kg/m2 35.45 kg/m2    Deteriorted , needs to be more consistent with diet change  "

## 2024-04-05 NOTE — Assessment & Plan Note (Signed)

## 2024-04-05 NOTE — Assessment & Plan Note (Signed)
" °  Patient re-educated about  the importance of commitment to a  minimum of 150 minutes of exercise per week as able.  The importance of healthy food choices with portion control discussed, as well as eating regularly and within a 12 hour window most days. The need to choose clean , green food 50 to 75% of the time is discussed, as well as to make water  the primary drink and set a goal of 64 ounces water  daily.       04/05/2024    2:55 PM 04/28/2023    4:05 PM 12/23/2022    4:13 PM  Weight /BMI  Weight 243 lb 233 lb 1.9 oz 240 lb 1.3 oz  Height 5' 9 (1.753 m) 5' 9 (1.753 m) 5' 9 (1.753 m)  BMI 35.88 kg/m2 34.43 kg/m2 35.45 kg/m2    Deteriorted , needs to be more consistent with diet change "

## 2024-04-06 ENCOUNTER — Ambulatory Visit: Payer: Self-pay | Admitting: Family Medicine

## 2024-04-06 LAB — CMP14+EGFR
ALT: 36 [IU]/L (ref 0–44)
AST: 27 [IU]/L (ref 0–40)
Albumin: 4.4 g/dL (ref 3.8–4.9)
Alkaline Phosphatase: 97 [IU]/L (ref 47–123)
BUN/Creatinine Ratio: 12 (ref 9–20)
BUN: 11 mg/dL (ref 6–24)
Bilirubin Total: 0.3 mg/dL (ref 0.0–1.2)
CO2: 24 mmol/L (ref 20–29)
Calcium: 9.7 mg/dL (ref 8.7–10.2)
Chloride: 104 mmol/L (ref 96–106)
Creatinine, Ser: 0.94 mg/dL (ref 0.76–1.27)
Globulin, Total: 3 g/dL (ref 1.5–4.5)
Glucose: 94 mg/dL (ref 70–99)
Potassium: 3.4 mmol/L — ABNORMAL LOW (ref 3.5–5.2)
Sodium: 143 mmol/L (ref 134–144)
Total Protein: 7.4 g/dL (ref 6.0–8.5)
eGFR: 94 mL/min/{1.73_m2}

## 2024-04-06 LAB — HEMOGLOBIN A1C
Est. average glucose Bld gHb Est-mCnc: 131 mg/dL
Hgb A1c MFr Bld: 6.2 % — ABNORMAL HIGH (ref 4.8–5.6)

## 2024-04-06 LAB — LIPID PANEL
Chol/HDL Ratio: 2.7 ratio (ref 0.0–5.0)
Cholesterol, Total: 170 mg/dL (ref 100–199)
HDL: 62 mg/dL
LDL Chol Calc (NIH): 70 mg/dL (ref 0–99)
Triglycerides: 239 mg/dL — ABNORMAL HIGH (ref 0–149)
VLDL Cholesterol Cal: 38 mg/dL (ref 5–40)

## 2024-04-06 LAB — CBC WITH DIFFERENTIAL/PLATELET
Basophils Absolute: 0 10*3/uL (ref 0.0–0.2)
Basos: 1 %
EOS (ABSOLUTE): 0.1 10*3/uL (ref 0.0–0.4)
Eos: 2 %
Hematocrit: 44.4 % (ref 37.5–51.0)
Hemoglobin: 15.4 g/dL (ref 13.0–17.7)
Immature Grans (Abs): 0 10*3/uL (ref 0.0–0.1)
Immature Granulocytes: 0 %
Lymphocytes Absolute: 2.2 10*3/uL (ref 0.7–3.1)
Lymphs: 40 %
MCH: 30.1 pg (ref 26.6–33.0)
MCHC: 34.7 g/dL (ref 31.5–35.7)
MCV: 87 fL (ref 79–97)
Monocytes Absolute: 0.5 10*3/uL (ref 0.1–0.9)
Monocytes: 8 %
Neutrophils Absolute: 2.7 10*3/uL (ref 1.4–7.0)
Neutrophils: 49 %
Platelets: 146 10*3/uL — ABNORMAL LOW (ref 150–450)
RBC: 5.11 x10E6/uL (ref 4.14–5.80)
RDW: 13 % (ref 11.6–15.4)
WBC: 5.6 10*3/uL (ref 3.4–10.8)

## 2024-04-06 LAB — VITAMIN D 25 HYDROXY (VIT D DEFICIENCY, FRACTURES): Vit D, 25-Hydroxy: 23.6 ng/mL — ABNORMAL LOW (ref 30.0–100.0)

## 2024-04-06 LAB — PSA: Prostate Specific Ag, Serum: 1.2 ng/mL (ref 0.0–4.0)

## 2024-04-06 LAB — PHENYTOIN LEVEL, TOTAL: Phenytoin (Dilantin), Serum: 14.3 ug/mL (ref 10.0–20.0)

## 2024-04-06 MED ORDER — POTASSIUM CHLORIDE ER 10 MEQ PO TBCR: 10.0000 meq | EXTENDED_RELEASE_TABLET | Freq: Every day | ORAL | 1 refills | Status: AC

## 2024-04-06 MED ORDER — VITAMIN D (ERGOCALCIFEROL) 1.25 MG (50000 UNIT) PO CAPS: 50000.0000 [IU] | ORAL_CAPSULE | ORAL | 5 refills | Status: AC

## 2024-04-12 ENCOUNTER — Ambulatory Visit (INDEPENDENT_AMBULATORY_CARE_PROVIDER_SITE_OTHER): Payer: Self-pay

## 2024-04-12 DIAGNOSIS — Z23 Encounter for immunization: Secondary | ICD-10-CM | POA: Diagnosis not present

## 2024-04-12 DIAGNOSIS — Z1159 Encounter for screening for other viral diseases: Secondary | ICD-10-CM

## 2024-06-14 ENCOUNTER — Ambulatory Visit: Payer: Self-pay

## 2024-10-18 ENCOUNTER — Ambulatory Visit: Payer: Self-pay | Admitting: Family Medicine

## 2025-04-12 ENCOUNTER — Encounter: Payer: Self-pay | Admitting: Internal Medicine
